# Patient Record
Sex: Female | Born: 1965 | Race: Black or African American | Hispanic: No | Marital: Married | Smoking: Never smoker
Health system: Southern US, Community
[De-identification: ages and names within clinical notes are randomized; demographics above are authoritative.]

## PROBLEM LIST (undated history)

## (undated) DIAGNOSIS — E669 Obesity, unspecified: Secondary | ICD-10-CM

## (undated) DIAGNOSIS — I639 Cerebral infarction, unspecified: Secondary | ICD-10-CM

## (undated) DIAGNOSIS — G4700 Insomnia, unspecified: Secondary | ICD-10-CM

## (undated) DIAGNOSIS — E538 Deficiency of other specified B group vitamins: Secondary | ICD-10-CM

## (undated) DIAGNOSIS — K52832 Lymphocytic colitis: Secondary | ICD-10-CM

## (undated) DIAGNOSIS — M329 Systemic lupus erythematosus, unspecified: Secondary | ICD-10-CM

## (undated) DIAGNOSIS — L304 Erythema intertrigo: Secondary | ICD-10-CM

## (undated) DIAGNOSIS — IMO0002 Reserved for concepts with insufficient information to code with codable children: Secondary | ICD-10-CM

## (undated) DIAGNOSIS — I10 Essential (primary) hypertension: Secondary | ICD-10-CM

## (undated) DIAGNOSIS — M797 Fibromyalgia: Secondary | ICD-10-CM

## (undated) DIAGNOSIS — Z8639 Personal history of other endocrine, nutritional and metabolic disease: Secondary | ICD-10-CM

## (undated) DIAGNOSIS — L821 Other seborrheic keratosis: Secondary | ICD-10-CM

## (undated) DIAGNOSIS — F419 Anxiety disorder, unspecified: Secondary | ICD-10-CM

## (undated) HISTORY — PX: BREAST LUMPECTOMY: SHX2

## (undated) HISTORY — PX: URETERAL STENT PLACEMENT: SHX822

## (undated) HISTORY — PX: BREAST REDUCTION SURGERY: SHX8

## (undated) HISTORY — PX: CHOLECYSTECTOMY: SHX55

---

## 1997-02-23 ENCOUNTER — Ambulatory Visit (HOSPITAL_COMMUNITY): Admission: RE | Admit: 1997-02-23 | Discharge: 1997-02-23 | Payer: Self-pay | Admitting: Obstetrics & Gynecology

## 1997-03-08 ENCOUNTER — Ambulatory Visit (HOSPITAL_COMMUNITY): Admission: RE | Admit: 1997-03-08 | Discharge: 1997-03-08 | Payer: Self-pay | Admitting: Obstetrics and Gynecology

## 1997-03-22 ENCOUNTER — Ambulatory Visit (HOSPITAL_COMMUNITY): Admission: RE | Admit: 1997-03-22 | Discharge: 1997-03-22 | Payer: Self-pay | Admitting: Obstetrics and Gynecology

## 1997-04-08 ENCOUNTER — Inpatient Hospital Stay (HOSPITAL_COMMUNITY): Admission: AD | Admit: 1997-04-08 | Discharge: 1997-04-08 | Payer: Self-pay | Admitting: Obstetrics and Gynecology

## 1997-04-19 ENCOUNTER — Ambulatory Visit (HOSPITAL_COMMUNITY): Admission: RE | Admit: 1997-04-19 | Discharge: 1997-04-19 | Payer: Self-pay | Admitting: Obstetrics and Gynecology

## 1997-05-03 ENCOUNTER — Encounter (HOSPITAL_COMMUNITY): Admission: RE | Admit: 1997-05-03 | Discharge: 1997-06-02 | Payer: Self-pay | Admitting: Obstetrics and Gynecology

## 1997-06-01 ENCOUNTER — Inpatient Hospital Stay (HOSPITAL_COMMUNITY): Admission: AD | Admit: 1997-06-01 | Discharge: 1997-06-04 | Payer: Self-pay | Admitting: Obstetrics and Gynecology

## 1998-07-24 ENCOUNTER — Inpatient Hospital Stay (HOSPITAL_COMMUNITY): Admission: AD | Admit: 1998-07-24 | Discharge: 1998-07-24 | Payer: Self-pay | Admitting: Obstetrics and Gynecology

## 1999-05-15 ENCOUNTER — Ambulatory Visit (HOSPITAL_BASED_OUTPATIENT_CLINIC_OR_DEPARTMENT_OTHER): Admission: RE | Admit: 1999-05-15 | Discharge: 1999-05-15 | Payer: Self-pay | Admitting: Specialist

## 1999-05-15 ENCOUNTER — Encounter (INDEPENDENT_AMBULATORY_CARE_PROVIDER_SITE_OTHER): Payer: Self-pay | Admitting: *Deleted

## 1999-10-18 ENCOUNTER — Encounter: Admission: RE | Admit: 1999-10-18 | Discharge: 1999-10-18 | Payer: Self-pay | Admitting: Otolaryngology

## 1999-10-18 ENCOUNTER — Encounter: Payer: Self-pay | Admitting: Otolaryngology

## 2000-03-12 ENCOUNTER — Ambulatory Visit (HOSPITAL_COMMUNITY): Admission: RE | Admit: 2000-03-12 | Discharge: 2000-03-12 | Payer: Self-pay | Admitting: Urology

## 2000-03-12 ENCOUNTER — Encounter: Payer: Self-pay | Admitting: Urology

## 2002-04-14 ENCOUNTER — Encounter: Payer: Self-pay | Admitting: Neurology

## 2002-04-14 ENCOUNTER — Ambulatory Visit (HOSPITAL_COMMUNITY): Admission: RE | Admit: 2002-04-14 | Discharge: 2002-04-14 | Payer: Self-pay | Admitting: Neurology

## 2003-04-08 ENCOUNTER — Other Ambulatory Visit: Admission: RE | Admit: 2003-04-08 | Discharge: 2003-04-08 | Payer: Self-pay | Admitting: Family Medicine

## 2004-04-14 ENCOUNTER — Encounter: Admission: RE | Admit: 2004-04-14 | Discharge: 2004-04-14 | Payer: Self-pay | Admitting: Family Medicine

## 2004-07-15 ENCOUNTER — Ambulatory Visit (HOSPITAL_COMMUNITY): Admission: RE | Admit: 2004-07-15 | Discharge: 2004-07-15 | Payer: Self-pay | Admitting: *Deleted

## 2004-09-30 ENCOUNTER — Encounter: Admission: RE | Admit: 2004-09-30 | Discharge: 2004-09-30 | Payer: Self-pay | Admitting: Neurology

## 2004-12-04 ENCOUNTER — Encounter: Admission: RE | Admit: 2004-12-04 | Discharge: 2004-12-04 | Payer: Self-pay | Admitting: Neurology

## 2004-12-11 ENCOUNTER — Encounter: Admission: RE | Admit: 2004-12-11 | Discharge: 2005-03-11 | Payer: Self-pay | Admitting: Neurology

## 2007-08-11 ENCOUNTER — Ambulatory Visit (HOSPITAL_COMMUNITY): Admission: RE | Admit: 2007-08-11 | Discharge: 2007-08-11 | Payer: Self-pay | Admitting: Obstetrics and Gynecology

## 2007-08-11 LAB — CONVERTED CEMR LAB: T3, Free: 2.7 pg/mL (ref 2.3–4.2)

## 2007-08-13 ENCOUNTER — Ambulatory Visit: Payer: Self-pay

## 2007-08-13 ENCOUNTER — Ambulatory Visit: Payer: Self-pay | Admitting: Cardiology

## 2007-08-13 ENCOUNTER — Encounter: Payer: Self-pay | Admitting: Cardiology

## 2007-08-19 ENCOUNTER — Ambulatory Visit (HOSPITAL_COMMUNITY): Admission: RE | Admit: 2007-08-19 | Discharge: 2007-08-19 | Payer: Self-pay | Admitting: Obstetrics and Gynecology

## 2007-08-19 ENCOUNTER — Ambulatory Visit: Payer: Self-pay | Admitting: Cardiology

## 2007-08-25 ENCOUNTER — Ambulatory Visit: Payer: Self-pay | Admitting: Cardiology

## 2007-08-26 ENCOUNTER — Ambulatory Visit (HOSPITAL_COMMUNITY): Admission: RE | Admit: 2007-08-26 | Discharge: 2007-08-26 | Payer: Self-pay | Admitting: Obstetrics and Gynecology

## 2007-09-02 ENCOUNTER — Ambulatory Visit (HOSPITAL_COMMUNITY): Admission: RE | Admit: 2007-09-02 | Discharge: 2007-09-02 | Payer: Self-pay | Admitting: Obstetrics and Gynecology

## 2007-10-08 ENCOUNTER — Inpatient Hospital Stay (HOSPITAL_COMMUNITY): Admission: AD | Admit: 2007-10-08 | Discharge: 2007-10-11 | Payer: Self-pay | Admitting: Obstetrics and Gynecology

## 2008-04-05 ENCOUNTER — Encounter: Admission: RE | Admit: 2008-04-05 | Discharge: 2008-04-05 | Payer: Self-pay | Admitting: Internal Medicine

## 2008-07-06 ENCOUNTER — Ambulatory Visit (HOSPITAL_COMMUNITY): Admission: RE | Admit: 2008-07-06 | Discharge: 2008-07-06 | Payer: Self-pay | Admitting: General Surgery

## 2008-07-06 ENCOUNTER — Encounter (INDEPENDENT_AMBULATORY_CARE_PROVIDER_SITE_OTHER): Payer: Self-pay | Admitting: General Surgery

## 2009-06-22 ENCOUNTER — Encounter (INDEPENDENT_AMBULATORY_CARE_PROVIDER_SITE_OTHER): Payer: Self-pay | Admitting: *Deleted

## 2009-07-26 ENCOUNTER — Encounter (INDEPENDENT_AMBULATORY_CARE_PROVIDER_SITE_OTHER): Payer: Self-pay | Admitting: *Deleted

## 2009-07-26 ENCOUNTER — Ambulatory Visit: Payer: Self-pay | Admitting: Gastroenterology

## 2009-07-26 DIAGNOSIS — R197 Diarrhea, unspecified: Secondary | ICD-10-CM

## 2009-07-26 DIAGNOSIS — E739 Lactose intolerance, unspecified: Secondary | ICD-10-CM

## 2009-07-26 DIAGNOSIS — E049 Nontoxic goiter, unspecified: Secondary | ICD-10-CM | POA: Insufficient documentation

## 2009-07-26 DIAGNOSIS — Z9089 Acquired absence of other organs: Secondary | ICD-10-CM

## 2009-07-26 DIAGNOSIS — M2550 Pain in unspecified joint: Secondary | ICD-10-CM | POA: Insufficient documentation

## 2009-07-27 ENCOUNTER — Encounter: Payer: Self-pay | Admitting: Gastroenterology

## 2009-07-27 DIAGNOSIS — D509 Iron deficiency anemia, unspecified: Secondary | ICD-10-CM

## 2009-07-27 DIAGNOSIS — E538 Deficiency of other specified B group vitamins: Secondary | ICD-10-CM | POA: Insufficient documentation

## 2009-07-27 LAB — CONVERTED CEMR LAB
AST: 20 units/L (ref 0–37)
Basophils Absolute: 0 10*3/uL (ref 0.0–0.1)
Calcium: 9.1 mg/dL (ref 8.4–10.5)
Chloride: 107 meq/L (ref 96–112)
Eosinophils Absolute: 0.1 10*3/uL (ref 0.0–0.7)
Ferritin: 4 ng/mL — ABNORMAL LOW (ref 10.0–291.0)
HCT: 34.6 % — ABNORMAL LOW (ref 36.0–46.0)
Hemoglobin: 11.7 g/dL — ABNORMAL LOW (ref 12.0–15.0)
IgA: 188 mg/dL (ref 68–378)
Iron: 37 ug/dL — ABNORMAL LOW (ref 42–145)
Lymphs Abs: 1.6 10*3/uL (ref 0.7–4.0)
MCHC: 33.8 g/dL (ref 30.0–36.0)
MCV: 78.6 fL (ref 78.0–100.0)
Monocytes Absolute: 0.3 10*3/uL (ref 0.1–1.0)
Potassium: 4.1 meq/L (ref 3.5–5.1)
RBC: 4.41 M/uL (ref 3.87–5.11)
Sodium: 140 meq/L (ref 135–145)
TSH: 0.7 microintl units/mL (ref 0.35–5.50)
Total Protein: 7.4 g/dL (ref 6.0–8.3)
Transferrin: 301 mg/dL (ref 212.0–360.0)
Vitamin B-12: 208 pg/mL — ABNORMAL LOW (ref 211–911)
WBC: 7.7 10*3/uL (ref 4.5–10.5)

## 2009-08-29 ENCOUNTER — Ambulatory Visit: Payer: Self-pay | Admitting: Gastroenterology

## 2009-08-31 ENCOUNTER — Telehealth: Payer: Self-pay | Admitting: Gastroenterology

## 2009-09-02 ENCOUNTER — Encounter: Payer: Self-pay | Admitting: Gastroenterology

## 2009-10-13 ENCOUNTER — Telehealth (INDEPENDENT_AMBULATORY_CARE_PROVIDER_SITE_OTHER): Payer: Self-pay | Admitting: *Deleted

## 2010-02-04 ENCOUNTER — Encounter: Payer: Self-pay | Admitting: Neurology

## 2010-02-06 ENCOUNTER — Encounter: Payer: Self-pay | Admitting: Obstetrics and Gynecology

## 2010-02-16 NOTE — Procedures (Signed)
Summary: Colonoscopy  Patient: [name] Note: All result statuses are Final unless otherwise noted.  Tests: (1) Colonoscopy (COL)   COL Colonoscopy           DONE     [facility]     [address]     [place], [place]  [address]           COLONOSCOPY PROCEDURE REPORT           PATIENT:  Jo Chung, Jo Chung  MR#:  [id number]     BIRTHDATE:  05/21/1965, 44 yrs. old  GENDER:  female     ENDOSCOPIST:  Vania Rea. Jarold Motto, MD, [facility] [address]     REF. BY:  Guerry Bruin, M.D.     PROCEDURE DATE:  08/29/2009     PROCEDURE:  Colonoscopy with biopsy     ASA CLASS:  Class II     INDICATIONS:  unexplained diarrhea, surveillance     MEDICATIONS:   Fentanyl 100 mcg IV, Versed 10 mg IV           DESCRIPTION OF PROCEDURE:   After the risks benefits and     alternatives of the procedure were thoroughly explained, informed     consent was obtained.  Digital rectal exam was performed and     revealed no abnormalities.   The LB [id number] endoscope     was introduced through the anus and advanced to the cecum, which     was identified by both the appendix and ileocecal valve, without     limitations.  The quality of the prep was excellent, using     MoviPrep.  The instrument was then slowly withdrawn as the colon     was fully examined.     <<PROCEDUREIMAGES>>           FINDINGS:  No polyps or cancers were seen.  This was otherwise a     normal examination of the colon. RANDOM BIOPSIES DONE.     Retroflexed views in the rectum revealed no abnormalities.    The     scope was then withdrawn from the patient and the procedure     completed.           COMPLICATIONS:  None     ENDOSCOPIC IMPRESSION:     1) No polyps or cancers     2) Otherwise normal examination     BILE-SALT ENTEROPATHY.R/O MICROSCOPIC/COLLAGENOUS COLITIS.     RECOMMENDATIONS:     1) Await biopsy results     2) Repeat Colonscopy in 10 years.     COLESTID DOSES AS NEEDED     REPEAT EXAM:  No        ______________________________     Vania Rea. Jarold Motto, MD, [facility]           CC:           n.     eSIGNED:   Vania Rea. Patterson at 08/29/2009 02:04 PM           Marcy Siren, [id number]  Note: An exclamation mark (!) indicates a result that was not dispersed into the flowsheet. Document Creation Date: 08/29/2009 2:05 PM _______________________________________________________________________  (1) Order result status: Final Collection or observation date-time: 08/29/2009 13:57 Requested date-time:  Receipt date-time:  Reported date-time:  Referring Physician:   Ordering Physician: Sheryn Bison [phone]) Specimen Source:  Source: Launa Grill Order Number: [id number] Lab site:   Appended Document: Colonoscopy     Procedures Next Due Date:  Colonoscopy: 08/2019

## 2010-02-16 NOTE — Progress Notes (Signed)
Summary: Nascobal  Phone Note Outgoing Call   Call placed by: Ashok Cordia RN,  August 31, 2009 9:56 AM Summary of Call: Per Dr. Jarold Motto,  Call pt and rx Nascobal.  LM for pt to call. Initial call taken by: Ashok Cordia RN,  August 31, 2009 9:57 AM  Follow-up for Phone Call        Rx mailed to pt for nascobal.  Co-pay card mailed also.  Pt asks if it is OK to take colested more than once a day if needed.  Does well most days on one daily but if gets stressed then will have diarrhea.  On these days can she take 2 tabs? Follow-up by: Ashok Cordia RN,  August 31, 2009 10:02 AM  Additional Follow-up for Phone Call Additional follow up Details #1::        yes...she can titrate per her symptoms up to 4 /day Additional Follow-up by: Mardella Layman MD FACG,  August 31, 2009 10:21 AM    Additional Follow-up for Phone Call Additional follow up Details #2::    Pt notified. Follow-up by: Ashok Cordia RN,  August 31, 2009 10:48 AM  New/Updated Medications: NASCOBAL 500 MCG/0.1ML SOLN (CYANOCOBALAMIN) 1 spray in one nostral once a week Prescriptions: NASCOBAL 500 MCG/0.1ML SOLN (CYANOCOBALAMIN) 1 spray in one nostral once a week  #1 x 6   Entered by:   Ashok Cordia RN   Authorized by:   Mardella Layman MD [facility]   Signed by:   Ashok Cordia RN on 08/31/2009   Method used:   Print then Mail to Patient   RxID:   [id number]

## 2010-02-16 NOTE — Progress Notes (Signed)
  Phone Note Other Incoming   Request: Send information Summary of Call: Request for records received from patient to go to Dr. Virgel Manifold. Request forwarded to [employer].

## 2010-02-16 NOTE — Assessment & Plan Note (Signed)
Summary: DIARRHEA X 1 YEAR...AS.   History of Present Illness Visit Type: Initial Visit Primary GI MD: Sheryn Bison MD FACP FAGA Primary Provider: Ernesto Rutherford, MD Chief Complaint: Diarrhea x 1 year History of Present Illness:   45 year old African American female who has a vague previous diagnosis of collagen vascular disease, fibromyalgia, possible sarcoidosis, and chronic hypothyroidism with associated thyroid goiter. She is on daily Synthroid and Xanax.  She is referred for two-year history of intermittent alternating diarrhea and constipation which has become predominantly diarrhea since cholecystectomy one year ago. She denies any systemic complaints, rectal bleeding, or dietary intolerances except to lactose. She's had no anorexia, weight loss, fever, chills, skin rashes, but does have vague arthralgias. She has not had previous 2 exams her endoscopic procedures. She works as an Charity fundraiser. She denies foreign travel or sick family members at home.   GI Review of Systems    Reports abdominal pain and  bloating.     Location of  Abdominal pain: lower abdomen.    Denies acid reflux, belching, chest pain, dysphagia with liquids, dysphagia with solids, heartburn, loss of appetite, nausea, vomiting, vomiting blood, weight loss, and  weight gain.      Reports change in bowel habits and  diarrhea.     Denies anal fissure, black tarry stools, constipation, diverticulosis, fecal incontinence, heme positive stool, hemorrhoids, irritable bowel syndrome, jaundice, light color stool, liver problems, rectal bleeding, and  rectal pain. Preventive Screening-Counseling & Management  Alcohol-Tobacco     Smoking Status: never      Drug Use:  no.      Current Medications (verified): 1)  Synthroid 112 Mcg Tabs (Levothyroxine Sodium) .... Once Daily 2)  Xanax 0.5 Mg Tabs (Alprazolam) .... Take 1 Tablet By Mouth As Needed For Sleep  Allergies (verified): No Known Drug Allergies  Past  History:  Past medical, surgical, family and social histories (including risk factors) reviewed for relevance to current acute and chronic problems.  Past Medical History: Hypothyroidism Lupus Sarcodoiosis Depression Fibromyalgia  Past Surgical History: Breast-Reduction Cholecystectomy C-Section X 2 Tubal Ligation Breast-Lumpectomy UPJ obstruction/ Kidney  Family History: Reviewed history from 07/25/2009 and no changes required. Family History of Heart Disease: Mother Family History of Cervical Cancer: Mother No FH of Colon Cancer:  Social History: Reviewed history and no changes required. 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  Alcohol Use - no Daily Caffeine Use 2 Illicit Drug Use - no Smoking Status:  never Drug Use:  no  Review of Systems       The patient complains of sleeping problems.  The patient denies allergy/sinus, anemia, anxiety-new, arthritis/joint pain, back pain, blood in urine, breast changes/lumps, change in vision, confusion, cough, coughing up blood, depression-new, fainting, fatigue, fever, headaches-new, hearing problems, heart murmur, heart rhythm changes, itching, menstrual pain, muscle pains/cramps, night sweats, nosebleeds, pregnancy symptoms, shortness of breath, skin rash, sore throat, swelling of feet/legs, swollen lymph glands, thirst - excessive , urination - excessive , urination changes/pain, urine leakage, vision changes, and voice change.   General:  Complains of sleep disorder; denies fever, chills, sweats, anorexia, fatigue, weakness, malaise, and weight loss. MS:  Complains of joint swelling and joint stiffness; denies joint pain / LOM, joint deformity, low back pain, muscle weakness, muscle cramps, muscle atrophy, leg pain at night, leg pain with exertion, and shoulder pain / LOM hand / wrist pain (CTS). Derm:  Denies rash, itching, dry skin, hives, moles, warts, and unhealing ulcers. Neuro:  Denies weakness, paralysis, abnormal  sensation, seizures, syncope, tremors, vertigo, transient blindness, frequent falls, frequent headaches, difficulty walking, headache, sciatica, radiculopathy other:, restless legs, memory loss, and confusion. Psych:  Complains of depression; denies anxiety, memory loss, suicidal ideation, hallucinations, paranoia, phobia, and confusion. Heme:  Denies bruising, bleeding, enlarged lymph nodes, and pagophagia.  Vital Signs:  Patient profile:   45 year old female Height:      59 inches Weight:      164.38 pounds BMI:     33.32 Pulse rate:   88 / minute Pulse rhythm:   regular BP sitting:   126 / 84  (left arm) Cuff size:   regular  Vitals Entered By: June McMurray CMA Duncan Dull) (July 26, 2009 2:30 PM)  Physical Exam  General:  Well developed, well nourished, no acute distress.healthy appearing.   Head:  Normocephalic and atraumatic. Eyes:  PERRLA, no icterus.exam deferred to patient's ophthalmologist.   Neck:  Supple; no masses or thyromegaly.thyroid gland is palpable but is nonnodular nontender. Lungs:  Clear throughout to auscultation. Heart:  Regular rate and rhythm; no murmurs, rubs,  or bruits. Abdomen:  Soft, nontender and nondistended. No masses, hepatosplenomegaly or hernias noted. Normal bowel sounds. Rectal:  Normal exam.Soft normal color stool which is guaiac-negative. Msk:  Symmetrical with no gross deformities. Normal posture. Extremities:  No clubbing, cyanosis, edema or deformities noted. Neurologic:  Alert and  oriented x4;  grossly normal neurologically. Cervical Nodes:  No significant cervical adenopathy. Psych:  Alert and cooperative. Normal mood and affect.   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 Labs and stool exams have been ordered and we'll check screening colonoscopy. I placed her on Colestid 1 g in  midmorning as tolerated along with lactose avoidance and elimination of fructose and sorbitol from her diet. Orders: TLB-CBC Platelet - w/Differential (85025-CBCD) TLB-BMP (Basic Metabolic Panel-BMET) (80048-METABOL) TLB-Hepatic/Liver Function Pnl (80076-HEPATIC) TLB-TSH (Thyroid Stimulating Hormone) (84443-TSH) TLB-B12, Serum-Total ONLY (65784-O96) TLB-Ferritin (82728-FER) TLB-Folic Acid (Folate) (82746-FOL) TLB-IBC Pnl (Iron/FE;Transferrin) (83550-IBC) TLB-IgA (Immunoglobulin A) (82784-IGA) TLB-Sedimentation Rate (ESR) (85652-ESR) T-Sprue Panel (Celiac Disease Aby Eval) (83516x3/[address]) T-Stool Fats [place] Stain [phone]) T-Stool Giardia / Crypto- EIA ([address]) T-Stool for O&P (72536-64403)  Problem # 2:  ARTHRALGIA (ICD-719.40) Assessment: Unchanged ??? underlying SLE. Records have been requested.  Problem # 3:  GOITER (ICD-240.9) Assessment: Unchanged History of chronic thyroiditis now on Synthroid therapy.  Problem # 4:  CHOLECYSTECTOMY, LAPAROSCOPIC, HX OF (ICD-V45.79) Assessment: Comment Only  Problem # 5:  LACTOSE INTOLERANCE (ICD-271.3) Assessment: Unchanged Lactose free diet suggested with p.r.n. Lactaid use.  Patient Instructions: 1)  Please go to the basement for lab work. 2)  Begin Colestid once daily. 3)  You are scheduled for a colonoscopy. 4)  The medication list was reviewed and reconciled.  All changed / newly prescribed medications were explained.  A complete medication list was provided to the patient / caregiver. 5)  Copy sent to : Dr. Ernesto Rutherford 6)  Please continue current medications.  7)  Lactose Intolerance brochure given.  8)  Stool exams ordered to exclude parasites and malabsorption.  Appended Document: DIARRHEA X 1 YEAR...AS.    Clinical Lists Changes  Medications: Added new medication of MOVIPREP 100 GM  SOLR (PEG-KCL-NACL-NASULF-NA ASC-C) As per prep instructions. - Signed Added new medication of COLESTID 1 GM TABS (COLESTIPOL  HCL) 1 by mouth once daily - Signed Rx of MOVIPREP 100 GM  SOLR (PEG-KCL-NACL-NASULF-NA ASC-C) As per prep instructions.;  #  1 x 0;  Signed;  Entered by: Ashok Cordia RN;  Authorized by: Mardella Layman MD [facility];  Method used: Electronically to [facility] [address] [phone]*, [address], [place], [place], [place]  [address], Ph: [phone] or [phone], Fax: [phone] Rx of COLESTID 1 GM TABS (COLESTIPOL HCL) 1 by mouth once daily;  #30 x 11;  Signed;  Entered by: Ashok Cordia RN;  Authorized by: Mardella Layman MD [facility];  Method used: Electronically to [facility][id number]*, [address], [place], [place], [place]  [address], Ph: [phone] or [phone], Fax: [phone] Orders: Added new Test order of Colonoscopy (Colon) - Signed    Prescriptions: COLESTID 1 GM TABS (COLESTIPOL HCL) 1 by mouth once daily  #30 x 11   Entered by:   Ashok Cordia RN   Authorized by:   Mardella Layman MD [facility]   Signed by:   Ashok Cordia RN on 07/26/2009   Method used:   Electronically to        [facility] [address] [phone]* (retail)       [address]       [place], [place]  [address]       Ph: [phone] or [phone]       Fax: [phone]   RxID:   [id number] MOVIPREP 100 GM  SOLR (PEG-KCL-NACL-NASULF-NA ASC-C) As per prep instructions.  #1 x 0   Entered by:   Ashok Cordia RN   Authorized by:   Mardella Layman MD [facility]   Signed by:   Ashok Cordia RN on 07/26/2009   Method used:   Electronically to        [facility] [address] [phone]* (retail)       [address]       [place], [place]  [address]       Ph: [phone] or [phone]       Fax: [phone]   RxID:   [id number]

## 2010-02-16 NOTE — Letter (Signed)
Summary: [facility] Instructions  [facility]  [address] [place], [place] [address]   Phone: [phone]  Fax: [phone]       Jo Chung    25-May-1965    MRN: [id number]        Procedure Day /Date: Friday, 08/05/09     Arrival Time: 2:30      Procedure Time: 3:30     Location of Procedure:                    _ X_  [facility] (4th Floor)                        PREPARATION FOR COLONOSCOPY WITH MOVIPREP   Starting 5 days prior to your procedure 07/29/09 do not eat nuts, seeds, popcorn, corn, beans, peas,  salads, or any raw vegetables.  Do not take any fiber supplements (e.g. Metamucil, Citrucel, and Benefiber).  THE DAY BEFORE YOUR PROCEDURE         DATE: 08/04/09    DAY: Thursday  1.  Drink clear liquids the entire day-NO SOLID FOOD  2.  Do not drink anything colored red or purple.  Avoid juices with pulp.  No orange juice.  3.  Drink at least 64 oz. (8 glasses) of fluid/clear liquids during the day to prevent dehydration and help the prep work efficiently.  CLEAR LIQUIDS INCLUDE: Water Jello Ice Popsicles Tea (sugar ok, no milk/cream) Powdered fruit flavored drinks Coffee (sugar ok, no milk/cream) Gatorade Juice: apple, white grape, white cranberry  Lemonade Clear bullion, consomm, broth Carbonated beverages (any kind) Strained chicken noodle soup Hard Candy                             4.  In the morning, mix first dose of MoviPrep solution:    Empty 1 Pouch A and 1 Pouch B into the disposable container    Add lukewarm drinking water to the top line of the container. Mix to dissolve    Refrigerate (mixed solution should be used within 24 hrs)  5.  Begin drinking the prep at 5:00 p.m. The MoviPrep container is divided by 4 marks.   Every 15 minutes drink the solution down to the next mark (approximately 8 oz) until the full liter is complete.   6.  Follow completed prep with 16 oz of clear liquid of your choice (Nothing  red or purple).  Continue to drink clear liquids until bedtime.  7.  Before going to bed, mix second dose of MoviPrep solution:    Empty 1 Pouch A and 1 Pouch B into the disposable container    Add lukewarm drinking water to the top line of the container. Mix to dissolve    Refrigerate  THE DAY OF YOUR PROCEDURE      DATE: 08/05/09    DAY: Friday  Beginning at 10:30 a.m. (5 hours before procedure):         1. Every 15 minutes, drink the solution down to the next mark (approx 8 oz) until the full liter is complete.  2. Follow completed prep with 16 oz. of clear liquid of your choice.    3. You may drink clear liquids until 1:30 (2 HOURS BEFORE PROCEDURE).   MEDICATION INSTRUCTIONS  Unless otherwise instructed, you should take regular prescription medications with a small sip of water   as early as possible  the morning of your procedure.                 OTHER INSTRUCTIONS  You will need a responsible adult at least 43 years of age to accompany you and drive you home.   This person must remain in the waiting room during your procedure.  Wear loose fitting clothing that is easily removed.  Leave jewelry and other valuables at home.  However, you may wish to bring a book to read or  an iPod/MP3 player to listen to music as you wait for your procedure to start.  Remove all body piercing jewelry and leave at home.  Total time from sign-in until discharge is approximately 2-3 hours.  You should go home directly after your procedure and rest.  You can resume normal activities the  day after your procedure.  The day of your procedure you should not:   Drive   Make legal decisions   Operate machinery   Drink alcohol   Return to work  You will receive specific instructions about eating, activities and medications before you leave.    The above instructions have been reviewed and explained to me by   _______________________    I fully understand and can  verbalize these instructions _____________________________ Date _________

## 2010-02-16 NOTE — Letter (Signed)
Summary: New Patient letter  [facility]  [address] [place], [place] [address]   Phone: [phone]  Fax: [phone]       06/22/2009 MRN: [id number]  Jo Chung [address] [place], [place]  [address]  Dear Jo Chung,  Welcome to the Gastroenterology Division at [facility].    You are scheduled to see Dr. Jarold Motto on 07/26/2009 at 2:45PM on the 3rd floor at [facility], [address]  We ask that you try to arrive at our office 15 minutes prior to your appointment time to allow for check-in.  We would like you to complete the enclosed self-administered evaluation form prior to your visit and bring it with you on the day of your appointment.  We will review it with you.  Also, please bring a complete list of all your medications or, if you prefer, bring the medication bottles and we will list them.  Please bring your insurance card so that we may make a copy of it.  If your insurance requires a referral to see a specialist, please bring your referral form from your primary care physician.  Co-payments are due at the time of your visit and may be paid by cash, check or credit card.     Your office visit will consist of a consult with your physician (includes a physical exam), any laboratory testing he/she may order, scheduling of any necessary diagnostic testing (e.g. x-ray, ultrasound, CT-scan), and scheduling of a procedure (e.g. Endoscopy, Colonoscopy) if required.  Please allow enough time on your schedule to allow for any/all of these possibilities.    If you cannot keep your appointment, please call [phone] to cancel or reschedule prior to your appointment date.  This allows [place] the opportunity to schedule an appointment for another patient in need of care.  If you do not cancel or reschedule by 5 p.m. the business day prior to your appointment date, you will be charged a $50.00 late cancellation/no-show fee.    Thank you for choosing  [facility] for your medical needs.  We appreciate the opportunity to care for you.  Please visit [place] at our website  to learn more about our practice.                     Sincerely,                                                             The Gastroenterology Division

## 2010-02-16 NOTE — Letter (Signed)
Summary: Patient Notice- Colon Biospy Results  [facility]  [address] [place], [place] [address]   Phone: [phone]  Fax: [phone]        September 02, 2009 MRN: [id number]    [facility] [address] [place], [place]  [address]    Dear Jo Chung,  I am pleased to inform you that the biopsies taken during your recent colonoscopy did not show any evidence of cancer upon pathologic examination.  Additional information/recommendations:  __No further action is needed at this time.  Please follow-up with      your primary care physician for your other healthcare needs.  __Please call [phone] to schedule a return visit to review      your condition.  _x_Continue with the treatment plan as outlined on the day of your      exam.  __You should have a repeat colonoscopy examination for this problem           in 10_ years.  Please call us if you are having persistent problems or have questions about your condition that have not been fully answered at this time.  Sincerely,  Mardella Layman MD [facility]   This letter has been electronically signed by your physician.  Appended Document: Patient Notice- Colon Biospy Results letter mailed

## 2010-04-24 LAB — CBC
HCT: 37.4 % (ref 36.0–46.0)
MCHC: 33.5 g/dL (ref 30.0–36.0)
MCV: 79.5 fL (ref 78.0–100.0)
Platelets: 268 10*3/uL (ref 150–400)
WBC: 7.5 10*3/uL (ref 4.0–10.5)

## 2010-05-30 NOTE — Op Note (Signed)
Jo Chung, FOGARTY               ACCOUNT NO.:  [id number]   MEDICAL RECORD NO.:  [id number]          PATIENT TYPE:  AMB   LOCATION:  SDS                          FACILITY:  [facility]   PHYSICIAN:  Ollen Gross. Vernell Morgans, M.D. DATE OF BIRTH:  12-23-1965   DATE OF PROCEDURE:  07/06/2008  DATE OF DISCHARGE:  07/06/2008                               OPERATIVE REPORT   PREOPERATIVE DIAGNOSIS:  Gallstones.   POSTOPERATIVE DIAGNOSIS:  Gallstones.   PROCEDURE:  Laparoscopic cholecystectomy with intraoperative  cholangiogram.   SURGEON:  Ollen Gross. Vernell Morgans, MD   ASSISTANT:  Ardeth Sportsman, MD   ANESTHESIA:  General endotracheal.   DESCRIPTION OF PROCEDURE:  After informed consent was obtained, the  patient was brought to the operating room and placed in the supine  position on the operating table.  After induction of general anesthesia,  the patient's abdomen was prepped with Betadine and draped in usual  sterile manner.  The area below the umbilicus was infiltrated with 0.25%  Marcaine.  A small incision was made with a 15-blade knife.  This  incision was carried down to the subcutaneous tissue bluntly with  hemostat and [employer] retractors until the linea alba was identified.  The linea alba was incised with a 15-blade knife, and each side was  grasped with Kocher clamps and elevated anteriorly.  The preperitoneal  space was then probed bluntly with a hemostat until the peritoneum was  opened and access was gained to the abdominal cavity.  A 0 Vicryl purse-  string stitch was then placed in the fascia around the opening.  A  Hasson cannula was placed through the opening and anchored in place with  a previously placed Vicryl purse-string stitch.  The abdomen was then  insufflated with carbon dioxide without difficulty.  A laparoscope was  inserted through the Hasson cannula and the right upper quadrant was  inspected.  The dome of the gallbladder and liver readily identified.  Next, the  epigastric region was infiltrated with 0.25% Marcaine.  A  small incision was made with a 15-blade knife.  A 10-mm port was placed  bluntly through this incision into the abdominal cavity under direct  vision.  Sites were then chosen laterally on the right side of the  abdomen for placement of 5-mm ports.  Each of these areas were  infiltrated with 0.25% Marcaine.  Small stab incisions were made with a  15-blade knife.  The 5-mm ports were then placed bluntly through these  incisions into the abdominal cavity under direct vision.  A blunt  grasper was placed through the lateral-most 5-mm port and used to grasp  the dome of gallbladder and elevated anteriorly and superiorly.  Another  blunt grasper was placed through the other 5-mm port and used to retract  on the body and neck of the gallbladder.  A dissector was placed through  the epigastric port and using the electrocautery, the peritoneal  reflection was opened at the gallbladder neck.  Blunt dissection was  then carried out in this area until the gallbladder neck cystic duct  junction was  readily identified and a good window was created.  A single  clip was placed on the gallbladder neck.  A small ductotomy was made  just below the clip with a laparoscopic scissors.  A Cook catheter was  then placed percutaneously through the anterior abdominal wall and the  right upper quadrant under direct vision.  The sharp stylet was removed.  The [facility] catheter was flushed.  The [facility] catheter was then placed in the  cystic duct and anchored in place with a clip.  Cholangiogram was  obtained that showed no filling defects, good emptying in duodenum, and  adequate length of cystic duct.  The anchoring clip and catheters were  removed from the patient.  Three clips were placed proximally in the  cystic duct, and duct was divided between the 2.  Next, posterior to  this, the cystic artery was identified and again dissected bluntly in a   circumferentially until a good window was created.  Two clips were  placed proximally and 1 distally on the artery, and the artery was  divided between the 2 with laparoscopic scissors.  Small branches of the  artery were identified and were controlled with clips.  Next, a  laparoscopic hook cautery device was used to separate the gallbladder  from the liver bed prior to completely detaching the gallbladder from  liver bed.  The liver bed was inspected and several small bleeding  points were coagulated with the electrocautery until the area was  completely hemostatic.  The laparoscopic bag was then inserted through  the epigastric port, the gallbladder was placed in the bag, and the bag  was sealed.  The abdomen was then irrigated with copious amounts of  saline until the effluent was clear.  The laparoscope was then moved to  the epigastric port.  A gallbladder grasper was placed through the  Hasson cannula and used to grasp the opening of the bag.  The bag with  the gallbladder was removed through the infraumbilical port without  difficulty.  The fascial defect was closed with the previously placed  Vicryl purse-string stitch as well as with another figure-of-eight 0  Vicryl stitch.  The rest of ports were then removed under direct vision  and were found to be hemostatic.  The gas was allowed to escape.  Skin  incisions were all closed with interrupted 4-0 Monocryl subcuticular  stitches.  Dermabond dressings were applied.  The patient tolerated the  procedure well.  At the end of the case, all needle, sponge, and  instrument counts were correct.  The patient was then awakened and taken  to recovery in stable condition.      Ollen Gross. Vernell Morgans, M.D.  Electronically Signed     PST/MEDQ  D:  07/06/2008  T:  07/06/2008  Job:  [id number]

## 2010-05-30 NOTE — Discharge Summary (Signed)
NAMESALVADOR, BIGBEE               ACCOUNT NO.:  [id number]   MEDICAL RECORD NO.:  [id number]          PATIENT TYPE:  INP   LOCATION:  9137                          FACILITY:  [facility]   PHYSICIAN:  Janine Limbo, M.D.DATE OF BIRTH:  1965/02/14   DATE OF ADMISSION:  10/08/2007  DATE OF DISCHARGE:  10/11/2007                               DISCHARGE SUMMARY   ADMITTING DIAGNOSES:  1. Intrauterine pregnancy at 35-5/7 weeks.  2. Preeclampsia.  3. Positive antinuclear antibody.  4. Sarcoidosis.  5. Positive Kell antibody titer  6. Desire for surgical sterilization.  7. History of 2 prior cesarean sections.  8. Hypothyroidism.  9. History of stillbirth.  10.Advanced maternal age.   DISCHARGE DIAGNOSES:  1. Intrauterine pregnancy at 35-5/7 weeks.  2. Preeclampsia.  3. Positive antinuclear antibody.  4. Sarcoidosis.  5. Positive Kell antibody titer  6. Desire for surgical sterilization.  7. History of 2 prior cesarean sections.  8. Hypothyroidism.  9. History of stillbirth.  10.Advanced maternal age.   PROCEDURES:  1. Repeat low-transverse cesarean section.  2. Bilateral tubal ligation.  3. Combination spinal epidural.   HOSPITAL COURSE:  Ms. Mcleish is a 45 year old gravida 6, para 4-0-1-3  at 35-5/7 weeks.  She presented from the office for cesarean section on  October 08, 2007, secondary to previous cesarean section and  preeclampsia and previous IUFD.  Her pregnancy had been remarkable for:  1. Advanced maternal age.  2. Grand multiparous.  3. Previous cesarean section x2 with 2 subsequently VBAC.  4. Hypothyroidism.  5. History of preeclampsia.  6. Lupus.  7. Sarcoidosis.  8. Depression.  9. Breast reduction.  10.Kell antibody with fourth pregnancy and subsequent IUFD.  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  12.IUFD with abruption.  13.History of pyelectasis with a history of ureteral obstruction.  14.Fetal tricuspid regurgitation on fetal echo.  15.Desires tubal.  16.Two C-sections with 2 subsequent VBAC and desires repeat C-section.   The patient was taken to the operating room by Dr. Pennie Rushing after  ensuring type and cross matching 2 units of packed red blood cells was  available.  A combination of spinal and epidural catheter was placed.  Although, the epidural was not dosed.  The repeat low-transverse  cesarean section and bilateral tubal ligation were performed by Dr.  Pennie Rushing.  Findings were a viable female by the name of Gerilyn Pilgrim, weight 6  pounds 15 ounces, and Apgars 8 and 9.  Tubes, uterus, and ovary were  normal.  The patient was taken to recovery in good condition.  Infant  was taken to the full-term nursery.  The patient was placed on magnesium  sulfate subsequent to delivery secondary to the previously noted  preeclampsia.  Her blood pressures were in 140 over high 80s.  Foley was  still in place.  On postop day #1, she was pumping for breast milk.  Her  blood pressure was 125/76.  Lochia was scant.  Fundus was firm.  Incision was clean and dry.  Dressing was clean, dry and intact.   Hemoglobin on day 1 was 10.8, white blood cell count 70.1,  platelet  count of 222.  Comprehensive metabolic panel was within normal limits  with SGOT of 23, SGPT of 17, BUN of 2, and creatinine of 0.41.  Magnesium sulfate was 5.0.  The patient's weight was 172.  The patient  was continued on magnesium for 24 hours.  By the afternoon of October 10, 2007, she was doing well.  She was still in NICU on magnesium, but  her pressures were in 136/87 and 110-136 over 58-87 range.  Vital signs  were stable.  She was afebrile.  Her weight was 175.5.  Urine output was  good.  Hemoglobin was 9.1, hematocrit 28%, and platelets 203.   Her physical exam was within normal limits.   Magnesium sulfate was discontinued and she was transferred to the floor.  She was continued on her prednisone at 10 mg p.o. daily and her  Synthroid 112 mcg per day.  On  postop day #3, she was doing well.  She  was up ad lib.  Her vital signs were stable.  She was pumping for breast  milk.  Her incision was clean, dry, and intact.  Extremities were within  normal limits.  Lochia was scant.  Fundus was firm.  She was deemed to  receive full benefit of hospital stay and was discharged home.   DISCHARGE INSTRUCTIONS:  Per [facility] handout.  The patient  will also follow up with her rheumatologist for followup medication.   PLAN:   DISCHARGE MEDICATIONS:  1. Motrin 600 mg p.o. q.6 h. p.r.n. pain.  2. Percocet 5/325 one to two p.o. daily q.4 h. p.r.n. pain.  3. The patient will continue her Synthroid 112 mcg p.o. daily.  4. Prednisone 10 mg p.o. daily.  5. Plaquenil use will be determined by her rheumatologist.   Discharge followup will occur in 6 weeks [facility].  Smart  start nurses will go up to see the patient this week for blood pressure  check.  She will follow up with a rheumatologist.      Renaldo Reel. Emilee Hero, C.N.M.      Janine Limbo, M.D.  Electronically Signed    VLL/MEDQ  D:  10/11/2007  T:  10/11/2007  Job:  [id number]

## 2010-05-30 NOTE — Assessment & Plan Note (Signed)
[facility]                            CARDIOLOGY OFFICE NOTE   [name], [name]                        MRN:          [id number]  DATE:08/11/2007                            DOB:          06-Sep-1965    REFERRING PHYSICIAN:  Hal Morales, M.D.   HISTORY OF PRESENT ILLNESS:  This is a 45 year old with no prior cardiac  problems who presents for evaluation of palpitations.  The patient does  have a history of recently diagnosed lupus, cutaneous sarcoidosis, and  hypothyroidism.  The patient is now [redacted] weeks pregnant.  She states that  for the last 12 to 14 weeks she has had episodes of palpitations several  times a day.  When these come on, they have strong and fast heart beats.  They are associated with a mild shortness of breath as well as mild  lightheadedness.  She has had no episodes of syncope.  Episodes tend to  last about 5 to 10 minutes.  They are not associated with chest pain.  They are not brought on by any particular event.  They seem to happen  fairly at random.  Also, the patient does note some shortness of breath  when she is climbing steps now, when she gets to the top of the flight  of stairs, that is new since she has been pregnant.  She states that she  really did not have shortness of breath with her 3 prior pregnancies.  The patient is not using any illicit drugs.   PAST MEDICAL HISTORY:  1. Systemic lupus erythematosus, this was diagnosed at this pregnancy.      Her symptoms have been arthritis and muscle pain.  2. Sarcoidosis.  This is primarily manifested cutaneously.  She has no      history of pulmonary sarcoid.  3. Hypothyroidism.  4. History of 3 prior pregnancies.  Her first pregnancy was      complicated by abruptio placentae and preeclampsia.  Her next 2      pregnancies were uneventful.   MEDICATIONS:  The patient takes prednisone 5 mg daily and Synthroid 112  mcg daily and a multivitamin.   SOCIAL HISTORY:   The patient lives with her husband.  She has 3 other  children.  She does not smoke.  She does not use any illicit drugs.  She  does not drink alcohol.   FAMILY HISTORY:  The patient's family history is significant mainly for  hypertension.   EKG, the patient's EKG was reviewed today, showed normal sinus rhythm.  No Q waves, normal axis and intervals, and no ST-T wave changes.   REVIEW OF SYSTEMS:  Negative except as noted in the history of present  illness.  She pertinently denies headaches.  She denies cough.  She  denies shortness of breath at rest.   PHYSICAL EXAMINATION:  VITAL SIGNS:  Heart rate 90 and regular and blood  pressure 120/78.  GENERAL:  No apparent distress.  NECK:  No JVD.  No carotid bruits.  LUNGS:  Clear to auscultation bilaterally.  HEART:  Regular S1 and S2.  There is a 2/6 systolic ejection murmur at  the upper sternal border.  ABDOMEN:  Gravid and nontender.  EXTREMITIES:  No edema.  There is 2+ dorsalis pedis pulses bilaterally.   ASSESSMENT/PLAN:  This is a 45 year old with a history of reasonably  diagnosed lupus, cutaneous sarcoid, and hypothyroidism who presents to  the cardiology clinic for evaluation of palpitations.  1. Palpitations.  The patient has these several times a day.  They are      associated with mild shortness of breath and mild lightheadedness.      There has been no frank syncope.  We will plan on going ahead and      evaluating this with a 48-hour Holter monitor.  We will also check      a TSH and a free T4 to make sure her hypothyroidism is under      adequate control and that she is not being overdosed with      Synthroid.  2. Shortness of breath.  The patient's shortness of breath that she      gets when she climbs stairs is likely a normal pregnancy related      manifestation.  However, she states she did not have this with her      first 3 pregnancies.  Therefore, we will go ahead and get an      echocardiogram to ensure that  her LV function is normal.  Also, she      does have a history of cutaneous sarcoidosis, and though unlikely      there is a chance that she could have involvement of her heart with      sarcoid which could lead to both the palpitations and shortness of      breath.  3. We will bring the patient back for evaluation after a Holter      monitor and echocardiogram in 7 to 10 days.     Jo Ancona, MD  Electronically Signed    DM/MedQ  DD: 08/11/2007  DT: 08/12/2007  Job #: [id number]   cc:   Hal Morales, M.D.  Noralyn Pick. Eden Emms, MD, [facility]

## 2010-05-30 NOTE — Op Note (Signed)
Jo Chung, Jo Chung               ACCOUNT NO.:  [id number]   MEDICAL RECORD NO.:  [id number]          PATIENT TYPE:  INP   LOCATION:  9371                          FACILITY:  [facility]   PHYSICIAN:  Hal Morales, M.D.DATE OF BIRTH:  May 09, 1965   DATE OF PROCEDURE:  DATE OF DISCHARGE:                               OPERATIVE REPORT   PREOPERATIVE DIAGNOSES:  Intrauterine pregnancy at 35 weeks and 5 days,  preeclampsia, positive antinuclear antibody sarcoidosis, positive Kell  antibody titer, desire for surgical sterilization, history of two prior  cesarean sections, hypothyroidism, and history of stillbirth advanced  maternal age.   POSTOPERATIVE DIAGNOSES:  Intrauterine pregnancy at 35 weeks and 5 days,  preeclampsia, positive antinuclear antibody sarcoidosis, positive Kell  antibody titer, desire for surgical sterilization, history of two prior  cesarean sections, hypothyroidism, and history of stillbirth advanced  maternal age.   OPERATIONS:  Repeat low-transverse cesarean section and bilateral tubal  ligation.   SURGEON:  Hal Morales, MD   FIRST ASSISTANT:  Renaldo Reel. Emilee Hero, C.N.M.   ANESTHESIA:  Combination spinal, epidural.   ESTIMATED BLOOD LOSS:  750 mL.   COMPLICATIONS:  None.   FINDINGS:  The uterus, tubes and ovaries were normal for the gravid  state.  The patient was delivered of a female infant whose name is Gerilyn Pilgrim  weighing 6 pounds 15 ounces with Apgars of 8 and 9 at 1 and 5 minutes  respectively.   PROCEDURE:  The patient was taken to the operating room after  appropriate identification and after typing and cross-matching 2 units  of packed red blood cells.  She was placed on the operating table.  A  combination of spinal and epidural catheter was placed though the  epidural was not dosed.  She was placed in the supine position with a  left lateral tilt.  The abdomen and perineum were prepped with multiple  layers of Betadine and a Foley catheter inserted  into the bladder under  sterile conditions and connected to straight drainage.  The abdomen was  draped as a sterile field.  After assurance of adequate surgical  anesthesia, the site of the previous cesarean section incision was  infiltrated with 20 mL of 0.25% Marcaine.  A suprapubic incision was  made in that side of the abdomen opened in layers.  The peritoneum was  entered and the bladder blade placed.  The uterus was incised  approximately 2 cm above the uterovesical fold and that incision taken  laterally on either side bluntly.  The membranes were ruptured and the  infant delivered from the occiput transverse position after the  reduction of a loose nuchal cord.  The nares and pharynx were suctioned  and the cord clamped and cut.  The infant was handed off to the awaiting  pediatricians.  The placenta was allowed to separate from the uterus and  was removed from the operative field.  The uterine incision was closed  with a running interlocking suture of 0 Vicryl after the cervix was  dilated with the sponge stick.  A second imbricating suture of 0 Vicryl  was then placed with adequate hemostasis.  Copious irrigation was  carried out.  The left fallopian tube was identified, followed to its  fimbriated end, then grasped at the isthmic portion and elevated.  A  suture of 2-0 chromic was placed through the mesosalpinx and tied fore  and aft on the knuckle of tube.  A second ligature was placed proximal  to that and the intervening knuckle of tube excised.  The cut ends were  cauterized and hemostasis noted to be adequate.  A similar procedure was  carried out on the opposite side.  The abdominal peritoneum was closed  with a running suture of 2-0 Vicryl.  The rectus muscles were  reapproximated in the midline with a figure-of-eight suture of 2-0  Vicryl.  The rectus muscles were made hemostatic with Bovie cautery and  irrigated.  The rectus fascia was closed with a running suture of  0  Vicryl, then reinforced on either side of midline with a figure-of-eight  suture of 0 Vicryl.  The subcutaneous tissue was made hemostatic with  Bovie cautery and irrigated.  The subcutaneous tissue was reapproximated  with a running suture of 0 plain.  The skin incision was closed with a  subcuticular suture of 3-0 Monocryl.  Steri-Strips were applied and a  sterile dressing applied.  The patient was taken from the operating room  to the recovery room in satisfactory condition having tolerated the  procedure well with sponge and instrument counts correct.   SPECIMENS TO PATHOLOGY:  Placenta.   The infant went to the full-term nursery.      Hal Morales, M.D.  Electronically Signed     VPH/MEDQ  D:  10/08/2007  T:  10/09/2007  Job:  [id number]

## 2010-05-30 NOTE — H&P (Signed)
Jo Chung, Jo Chung               ACCOUNT NO.:  [id number]   MEDICAL RECORD NO.:  [id number]          PATIENT TYPE:  INP   LOCATION:  9371                          FACILITY:  [facility]   PHYSICIAN:  Hal Morales, M.D.DATE OF BIRTH:  1965/04/16   DATE OF ADMISSION:  10/08/2007  DATE OF DISCHARGE:                              HISTORY & PHYSICAL   Ms. Jo Chung is a 45 year old gravida 6, para 4-0-1-3 at 35-5/7 weeks who  presented from the office for cesarean section secondary to previous C-  section, preeclampsia versus gestational hypertension today and for  history of previous IUFD.  Her pregnancy has been remarkable for:   1. Advanced maternal age.  2. Grand multiparous.  3. Previous C-section x2 with two subsequent VBACs.  4. Hypothyroidism.  5. History of preeclampsia.  6. Lupus.  7. Sarcoidosis.  8. Depression.  9. Breast reduction.  10.Anti-Kell antibody with fourth pregnancy and a subsequent IUFD.  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  12.IUFD with abruption.  13.History of pyelectasis with a history of ureteral obstruction.  14.Fetal tricuspid regurgitation on fetal echo with the baby needing a      postnatal echo if a murmur is auscultated.  15.Desires tubal.  16.Two C-sections with two subsequent VBACs but desire for repeat      cesarean section today.   PRENATAL LABS:  Blood type is A positive, Rh antibody is positive for  Kell, sickle cell test was negative, RPR is nonreactive.  Rubella titer  is immune.  Hepatitis B surface antigen negative.  HIV nonreactive.  Her  anti-Kell antibody was 2 at her initial visit.  It had been 8 in 1999.  Pap was normal.  GC and chlamydia cultures were negative at her first  visit.  First trimester screen was normal.  She declined amnio.  UA  showed a positive culture at 15 weeks.  She was treated with Macrobid.  UTI symptoms were resolved.  She had a 1-hour Glucola that was elevated  at 156.  She had a 3-hour GTT and 3-hour  GTT was normal.  She had a low  thyroid level at one point.  TSH done at 26 weeks was normal.  Anti-Kell  titer at 26 weeks was 8.  Hemoglobin was 10.4 at 28 weeks.  Her 24-hour  urine at 26 weeks was within normal limits.  Group B strep culture was  not done prior to her delivery.   HISTORY OF PRESENT PREGNANCY:  The patient entered care at approximately  9 weeks.  She had an ultrasound at that first visit for dating purposes.  Routine labs were done.  Anti-Kell antibody was 2.  It had been 8 in  1999.  She had a first trimester screen.  She was initially desiring  VBAC.  She had another first trimester screen at 13-6/7 weeks.  She had  a positive UTI at 14 weeks and was treated with Macrobid.  She had an  AFP that was normal.  She had an anatomy ultrasound at 20 weeks showing  normal growth.  Down syndrome risk at that time was 1 in  1648.  She was  having some headaches.  She was given Vicodin p.r.n., but declined a  referral to neurologist.  She had some limitation of ultrasound at 20  weeks so she had a repeat at 21 weeks showing normal growth.  She was  begun on maternity leave at 20 weeks secondary to extreme fatigue and  minimal weight gain.  Her rheumatologist at that time also recommended  prednisone.  She had a cold that began about 21 weeks which was treated  symptomatically.  At 24 weeks she was still having some issues with the  URI but it was clearing.  She was on prednisone 2.5 mg daily beginning  at 24 weeks.  There was at that time the previously noted diagnosis of  sarcoidosis but then was also diagnosed with likely lupus at 24 weeks.  She had a 24-hour urine at 26 weeks that was normal, normal creatinine.  She had a fetal echo done at 26 weeks showing trivial tricuspid  regurgitation and the plan was made to repeat that at 6 weeks  subsequently.  She did have positive Kell antibody.  This was checked  again at 26 weeks.  She had a somewhat low thyroid level.  She had a  TSH  at 26 weeks.  Plaquenil was also discussed by her rheumatologist.  This  was started at approximately 29 weeks.  She had an elevated 1-hour  Glucola of 156.  A 3-hour GTT was done.  She also had an anti-Kell titer  at approximately 27 weeks of 8.  TSH was normal at 0.766.  At 29 weeks  she had another ultrasound showing growth of greater than 90th  percentile, normal cervical length and normal fluid.  Recommendations  from maternal fetal medicine at 29 weeks was to check the father of the  baby for anti-Kell.  Plan was to continue to follow up with Dopplers in  maternal fetal medicine, NSTs twice weekly.  She had significant nausea  and vomiting at 29 weeks.  Kell antibody at that time was 1.8.  She was  having weekly Dopplers with maternal fetal medicine.  Cardiologist had  evaluated her heart and felt it was fine.  By 29 weeks she was desiring  a repeat C-section and tubal sterilization.  She was also placed on  Zofran.  At 31 weeks she had normal BPP, Dopplers and growth.  Fluid at  32 weeks was at the 80th percentile.  BPP was 8 and Dopplers were  normal.  By 33 weeks she was on Plaquenil 200 mg p.o. b.i.d. but was  only taking it once a day, prednisone 5 mg daily, Synthroid, Vicodin,  Zofran and Protonix.  She was only able to take the Plaquenil every day  due to nausea.  She had an ultrasound at 33 weeks also showing growth at  the 89th percentile, normal fluid, normal Dopplers and a BPP of 8/8.  Echocardiogram was done on the fetus at 34 weeks and this did show the  tricuspid regurgitation.  Plan was made to follow up postnatally with  the pediatrician in the nursery if murmur is noted.  At 34-6/7 weeks she  had an ultrasound showing normal growth, normal Dopplers and normal BPP.  Prednisone was increased to 10 mg per day to help with leg pain.  She  was unable to take the full dose by Plaquenil and the decision was made  that she would need a steroid boost for the C-section.   She was  seen in  the office on September 23 and had 2+ protein on a voided specimen, 3+  on a catheterized specimen and a blood pressure of 140/90.  At that time  she was admitted to the hospital for a C-section.  She did have some  nausea and vomiting at that time too.   OBSTETRICAL HISTORY:  In 1991 she had a primary low transverse cesarean  section for a female infant, weight 7 pounds at 40 weeks.  She did have  some type of anesthesia.  She had a stillbirth that was due to  preeclampsia abruption.  She did go into DIC.  In 1992 she had a repeat  C-section for a female infant, weight 7 pounds 12 ounces at 37 weeks.  She  had no complications.  In 1997 she had a VBAC of a female infant, weight  6 pounds 5 ounces at 37-5/7 weeks.  She was in labor 24 hours.  She had  epidural anesthesia.  That baby was delivered by Dr. Stefano Gaul.  She was  induced for nonreassuring fetal heart rate.  In 1999 she had a vaginal  birth after cesarean for a female infant, weight 6 pounds 5 ounces at 37-  6/7 weeks.  She was in labor for 4 hours.  That child was delivered by  Dr. Stefano Gaul with an epidural anesthesia.  She had positive anti-Kell  antibodies noted at that time.  She was induced.  She had lost a lot of  weight during that pregnancy.  In 2008 she had a miscarriage at 9 weeks.  With all pregnancies she was on iron supplementation.  She did have a  previously noted stillbirth history with her first pregnancy.  She did  have first trimester bleeding with her second pregnancy and DIC with her  first pregnancy.  I believe that she also had preeclampsia with that  pregnancy.  She did have group B strep with her fourth pregnancy.   MEDICAL HISTORY:  She was on previous birth control pill, ParaGard and  Depo-Provera user.  She has occasional yeast infections.  She reports  the usual childhood illnesses.  She has been vaccinated against  hepatitis and she had varicella as a child.  She does have a history  of  anemia with pregnancies.  In 1992 she had a blood transfusion after her  first delivery.  She has hypothyroidism and has been on Synthroid prior  to pregnancy.  That dose was 0.112 mg.  She has had occasional UTIs.  She had pyelo during one of her pregnancies.  She was diagnosed with  sarcoidosis in 2000.  She does suffer from depression but is not on any  medication.  She has been treated by Dr. Evelene Croon with Zoloft in the past.   SURGICAL HISTORY:  Includes breast reduction in 1991.  She had a  urethral pelvic junction obstruction in 1998 and had surgery for that.  At 45 years old she had fibrous tissue removed.  She was also  hospitalized previously for pyelonephritis during her pregnancy.   FAMILY HISTORY:  Her mother has coronary artery disease.  Her parents,  paternal grandmother, paternal grandfather, maternal grandmother and  maternal grandfather all have hypertension.  Her mother has anemia.  Her  mom has COPD.  Her father has emphysema.  Her mother has history of  hyperthyroidism.  Sister has MS.  Maternal grandfather has prostate  cancer.  Her mom had cervical cancer.  Her mother, father and brother  all suffer  from depression.  Her parents are alcoholics.   GENETIC HISTORY:  Is remarkable for the following, the baby's first  cousin having mental retardation.  The father of the baby's nephew has  hydrocephalus that was a twin preemie.  The patient does have history of  anti-Kell antibody with her fourth pregnancy identified.  She has a  history of a stillbirth with placental abruption in her first pregnancy.   SOCIAL HISTORY:  The patient is married to the father of baby.  He is  involved and supportive.  His name is Kimanh Templeman.  The patient is  [place] and of [address] faith.  She has a BS in nursing.  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  Her husband has an associate  degree in textiles.  The patient has no known medication allergies.  The  patient  denies any alcohol, drug or tobacco use or illicit drug use  during this pregnancy.   PHYSICAL EXAMINATION:  VITAL SIGNS:  Blood pressure is 143/88, other  vital signs are stable.  HEENT:  Within normal limits.  LUNGS:  Breath sounds are clear.  HEART:  Regular rate and rhythm without murmur.  BREASTS:  Soft and nontender.  ABDOMEN:  Fundal height is approximately 30 cm.  Estimated fetal weight  is 6 pounds.  Uterine contractions are very occasional and mild.  PELVIC:  Exam is deferred.  EXTREMITIES:  Deep tendon reflexes are 2+ without clonus.  There is a 1+  edema noted in the lower extremities.   PIH labs today.  Hemoglobin is 10.1, hematocrit 30.8, platelet count 206  and white blood cell count of 7.5.  Uric acid was 5.4.  LDH was 120.  Comprehensive metabolic panel - sodium is 136, potassium 3.3, chloride  97, CO2 23, glucose 83, BUN 3, creatinine 0.42, SGOT 17, SGPT 16.  The  patient was typed and crossed for 2 units of packed red blood cells and  they were screened for, the patient was screened for the antibody and  she did have anti-Kell.   ASSESSMENT:  1. Intrauterine pregnancy at 35-5/7 weeks.  2. Lupus.  3. Gestational hypertension versus probable preeclampsia.  4. 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  5. Sarcoidosis.  6. Previous C-section x2 with subsequent 2 VBACs (vaginal birth after      cesareans), however, the patient desires C-section and tubal      sterilization today.   PLAN:  1. Admit to [facility] for consult with Dr. Dierdre Forth as attending physician.  2. Routine physician preoperative orders.  3. Anesthesia consult for lupus and sarcoidosis.  4. Steroid boost will be determined by Dr. Pennie Rushing.  5. Desires tubal sterilization.  6. Will be on magnesium sulfate and anticipate it for 24 hours      subsequent to delivery.      Renaldo Reel Emilee Hero, C.N.M.      ______________________________  Hal Morales,  M.D.    VLL/MEDQ  D:  10/09/2007  T:  10/09/2007  Job:  [id number]

## 2010-05-30 NOTE — Assessment & Plan Note (Signed)
[facility]                            CARDIOLOGY OFFICE NOTE   Jo Chung, Jo Chung                        MRN:          [id number]  DATE:08/25/2007                            DOB:          01/10/1966    REFERRING PHYSICIAN:  Hal Morales, M.D.   HISTORY OF PRESENT ILLNESS:  This is a 45 year old with no prior cardiac  problems who presents for evaluation of palpitations.  She is now [redacted]  weeks pregnant.  She does have a history of recently diagnosed lupus,  cutaneous sarcoidosis, and hypothyroidism.  I saw her in the clinic  about a week ago for significant palpitations in the setting of  pregnancy.  She states that the palpitations are somewhat better this  week, now that 2 of her children are away on vacation and she feels like  she is under less stress.  She does state that the palpitations are more  common if she is moving around, exerting herself, and less if she is at  rest.  We did check her thyroid indices, which were normal.  We checked  an echocardiogram, which was unremarkable and she did have a Holter  monitor, which showed that these palpitations were likely due to  premature atrial contractions.  The patient denies any chest pain or  shortness of breath.   PAST MEDICAL HISTORY:  1. Systemic lupus erythematosus as diagnosed at this pregnancy.  Her      symptoms are arthritis and muscle pain.  2. Sarcoidosis.  This is primary manifested cutaneously.  She has no      history of pulmonary sarcoidosis.  3. Hypothyroidism.  Thyroid indices checked in July 2009 showed normal      TSH, normal free T3, normal T4.  4. Palpitations.  Workup with palpitations includes a Holter monitor      done in August 2009 showing occasional premature atrial      contractions.  Additionally, she had an echocardiogram done in      August 2009 showing normal left ventricular size, and normal      ejection fraction of 70%, no regional wall motion  abnormalities,      normal RV size and function and normal valves.   MEDICATIONS:  The patient is taking Synthroid 112 mcg daily and a  multivitamin.  The patient is taking Plaquenil 200 mg b.i.d. and  prednisone 5 mg daily.   SOCIAL HISTORY:  The patient lives with her husband.  She has 3  children.  Does not smoke, does not use illicit drugs, and does not  drink alcohol.   PHYSICAL EXAMINATION:  VITAL SIGNS:  Today, blood pressure is 115/70,  heart rate is 82, weight is 172 pounds.  GENERAL:  No apparent distress.  This is a gravid female.  NECK:  No JVD.  LUNGS:  Clear to auscultation bilaterally.  HEART:  Regular S1, S2.  There is a 2/6 systolic ejection murmur at the  upper sternal murmur.  No S3.  No S4.  EXTREMITIES:  No peripheral edema.  ABDOMEN:  Gravid and nontender.   ASSESSMENT  AND PLAN:  This is a 45 year old with a history of recently  diagnosed lupus, cutaneous sarcoidosis, hypothyroidism, presented to  Cardiology Clinic for evaluation of palpitations.  1. Palpitations.  These are likely premature atrial contractions.      These were noted on Holter monitor.  Likely, these have been      increased in the setting of pregnancy and hopefully will decrease      once she has delivered.  Also, her TSH and free T4 were normal      suggesting her hypothyroidism is under adequate control.  2. Echocardiogram showed normal left ventricle size and function,      normal right ventricle size and function.  Normal valves.  3. The patient can follow up in Cardiology Clinic p.r.n., but she does      not need to be seen back necessarily at this time.     Marca Ancona, MD  Electronically Signed    DM/MedQ  DD: 08/25/2007  DT: 08/26/2007  Job #: [id number]   cc:   Rollene Rotunda, MD, [facility]

## 2010-06-02 NOTE — Op Note (Signed)
[facility]  Patient:    Jo Chung, Jo Chung                           MRN: [id number] Proc. Date: 05/15/99 Adm. Date:  [id number] Attending:  Gustavus Messing CC:         Yaakov Guthrie. Shon Hough, M.D. x 2                           Operative Report  HISTORY:  A 45 year old lady with severe macromastia, back and shoulder pain secondary to large pendulous breasts.  PROCEDURES PERFORMED:  Bilateral breast reductions using the inferior pedicle technique.  ATTENDING:  Yaakov Guthrie. Shon Hough, M.D.  ASSISTANT:  Margaretha Sheffield.  ANESTHESIA:  General.  DESCRIPTION OF PROCEDURE:  Preoperatively, the patient was sat up and drawn off for the reduction mammoplasty inferior pedicle technique, remarking the nipple-areolar complex to 20 cm from the suprasternal notch.  She then underwent general anesthesia, intubated orally.  Prep was done to the chest, breast areas in the routine fashion using Betadine soap and solution and walled off with sterile towels and drapes so as to make a sterile field. Then, 0.25% Xylocaine with epinephrine was injected locally, 1:400,000 concentration, total of 150 cc per side.  The wounds were scored with #15 blades.  Then, the skin over the inferior pedicle was deepithelialized with a #20 blades.  The medial and lateral fatty dermal pedicles were excised down to underlying fascia.  After that, more tissue was taken laterally to improve contour.  The new keyhole area was also debulked.  After proper hemostasis, the flaps were transposed and stayed with 3-0 Prolene.  Subcutaneously closure was done with 3-0 Monocryl x 2 layers and a running subcuticular stitch of 3-0 Monocryl and 5-0 Monocryl throughout the inverted T.  The wounds were drained with #10 Blake drains, which were placed in the depths of the wound and brought out through the lateral most portion of the incision, secured with 3-0 Prolene.  The wounds were cleansed, Steri-Strips  and soft dressings were applied to all areas.  The soft dressings were taped with Hypafix.  She withstood the procedure very well, was taken to recovery in excellent condition. DD:  05/15/99 TD:  05/15/99 Job: [id number]

## 2010-09-22 ENCOUNTER — Other Ambulatory Visit: Payer: Self-pay | Admitting: Gastroenterology

## 2010-10-16 LAB — CBC
HCT: 30.8 — ABNORMAL LOW
Hemoglobin: 9.1 — ABNORMAL LOW
MCHC: 32.6
MCV: 78.7
RBC: 3.99
RBC: 4.24
RDW: 16.6 — ABNORMAL HIGH
RDW: 17 — ABNORMAL HIGH
WBC: 17.1 — ABNORMAL HIGH
WBC: 7.5

## 2010-10-16 LAB — COMPREHENSIVE METABOLIC PANEL
ALT: 14
ALT: 17
AST: 17
AST: 23
Albumin: 2.4 — ABNORMAL LOW
BUN: 3 — ABNORMAL LOW
CO2: 22
Calcium: 6.8 — ABNORMAL LOW
Calcium: 7.4 — ABNORMAL LOW
Calcium: 8.2 — ABNORMAL LOW
Chloride: 103
Chloride: 107
Creatinine, Ser: 0.55
GFR calc Af Amer: >60
GFR calc non Af Amer: >60
GFR calc non Af Amer: >60
Glucose, Bld: 85
Potassium: 3.3 — ABNORMAL LOW
Sodium: 131 — ABNORMAL LOW
Sodium: 140
Total Bilirubin: 0.5
Total Bilirubin: 0.6
Total Protein: 4.7 — ABNORMAL LOW
Total Protein: 5.4 — ABNORMAL LOW

## 2010-10-16 LAB — CROSSMATCH
DAT, IgG: NEGATIVE
PT AG Type: NEGATIVE

## 2010-10-31 ENCOUNTER — Other Ambulatory Visit: Payer: Self-pay | Admitting: Gastroenterology

## 2011-05-29 ENCOUNTER — Other Ambulatory Visit: Payer: Self-pay | Admitting: Gastroenterology

## 2012-02-08 ENCOUNTER — Other Ambulatory Visit: Payer: Self-pay | Admitting: Gastroenterology

## 2013-05-07 ENCOUNTER — Ambulatory Visit: Payer: PRIVATE HEALTH INSURANCE | Attending: Psychiatry

## 2013-05-07 ENCOUNTER — Encounter: Payer: Self-pay | Admitting: Speech Pathology

## 2013-05-07 DIAGNOSIS — Z8673 Personal history of transient ischemic attack (TIA), and cerebral infarction without residual deficits: Secondary | ICD-10-CM | POA: Insufficient documentation

## 2013-05-07 DIAGNOSIS — R279 Unspecified lack of coordination: Secondary | ICD-10-CM | POA: Insufficient documentation

## 2013-05-07 DIAGNOSIS — R262 Difficulty in walking, not elsewhere classified: Secondary | ICD-10-CM | POA: Insufficient documentation

## 2013-05-07 DIAGNOSIS — Z5189 Encounter for other specified aftercare: Secondary | ICD-10-CM | POA: Insufficient documentation

## 2013-05-08 ENCOUNTER — Ambulatory Visit: Payer: PRIVATE HEALTH INSURANCE | Admitting: Occupational Therapy

## 2013-05-12 ENCOUNTER — Ambulatory Visit: Payer: PRIVATE HEALTH INSURANCE | Admitting: Occupational Therapy

## 2013-05-14 ENCOUNTER — Ambulatory Visit: Payer: PRIVATE HEALTH INSURANCE | Admitting: Occupational Therapy

## 2013-05-18 ENCOUNTER — Ambulatory Visit: Payer: PRIVATE HEALTH INSURANCE | Admitting: Physical Therapy

## 2013-05-18 ENCOUNTER — Ambulatory Visit: Payer: PRIVATE HEALTH INSURANCE | Attending: Psychiatry | Admitting: Occupational Therapy

## 2013-05-18 DIAGNOSIS — Z8673 Personal history of transient ischemic attack (TIA), and cerebral infarction without residual deficits: Secondary | ICD-10-CM | POA: Diagnosis not present

## 2013-05-18 DIAGNOSIS — R262 Difficulty in walking, not elsewhere classified: Secondary | ICD-10-CM | POA: Diagnosis not present

## 2013-05-18 DIAGNOSIS — Z5189 Encounter for other specified aftercare: Secondary | ICD-10-CM | POA: Insufficient documentation

## 2013-05-18 DIAGNOSIS — R279 Unspecified lack of coordination: Secondary | ICD-10-CM | POA: Insufficient documentation

## 2013-05-22 ENCOUNTER — Ambulatory Visit: Payer: PRIVATE HEALTH INSURANCE | Admitting: Occupational Therapy

## 2013-05-22 ENCOUNTER — Ambulatory Visit: Payer: PRIVATE HEALTH INSURANCE | Admitting: Physical Therapy

## 2013-05-22 DIAGNOSIS — Z5189 Encounter for other specified aftercare: Secondary | ICD-10-CM | POA: Diagnosis not present

## 2013-05-25 ENCOUNTER — Ambulatory Visit: Payer: PRIVATE HEALTH INSURANCE | Admitting: Physical Therapy

## 2013-05-25 ENCOUNTER — Ambulatory Visit: Payer: PRIVATE HEALTH INSURANCE | Admitting: Occupational Therapy

## 2013-05-25 DIAGNOSIS — Z5189 Encounter for other specified aftercare: Secondary | ICD-10-CM | POA: Diagnosis not present

## 2013-05-26 ENCOUNTER — Ambulatory Visit: Payer: PRIVATE HEALTH INSURANCE

## 2013-05-26 ENCOUNTER — Ambulatory Visit: Payer: PRIVATE HEALTH INSURANCE | Admitting: Occupational Therapy

## 2013-05-26 DIAGNOSIS — Z5189 Encounter for other specified aftercare: Secondary | ICD-10-CM | POA: Diagnosis not present

## 2013-06-01 ENCOUNTER — Ambulatory Visit: Payer: PRIVATE HEALTH INSURANCE | Admitting: Physical Therapy

## 2013-06-01 ENCOUNTER — Encounter: Payer: PRIVATE HEALTH INSURANCE | Admitting: Occupational Therapy

## 2013-06-03 ENCOUNTER — Ambulatory Visit: Payer: PRIVATE HEALTH INSURANCE

## 2013-06-03 ENCOUNTER — Ambulatory Visit: Payer: PRIVATE HEALTH INSURANCE | Admitting: Occupational Therapy

## 2013-06-03 DIAGNOSIS — Z5189 Encounter for other specified aftercare: Secondary | ICD-10-CM | POA: Diagnosis not present

## 2013-06-09 ENCOUNTER — Ambulatory Visit: Payer: PRIVATE HEALTH INSURANCE | Admitting: Occupational Therapy

## 2013-06-09 ENCOUNTER — Ambulatory Visit: Payer: PRIVATE HEALTH INSURANCE | Admitting: Physical Therapy

## 2013-06-09 DIAGNOSIS — Z5189 Encounter for other specified aftercare: Secondary | ICD-10-CM | POA: Diagnosis not present

## 2013-06-11 ENCOUNTER — Ambulatory Visit: Payer: PRIVATE HEALTH INSURANCE | Admitting: Occupational Therapy

## 2013-06-11 ENCOUNTER — Ambulatory Visit: Payer: PRIVATE HEALTH INSURANCE | Admitting: Physical Therapy

## 2013-06-11 DIAGNOSIS — Z5189 Encounter for other specified aftercare: Secondary | ICD-10-CM | POA: Diagnosis not present

## 2013-06-15 ENCOUNTER — Ambulatory Visit: Payer: PRIVATE HEALTH INSURANCE | Attending: Psychiatry

## 2013-06-15 ENCOUNTER — Ambulatory Visit: Payer: PRIVATE HEALTH INSURANCE | Admitting: Occupational Therapy

## 2013-06-15 DIAGNOSIS — R262 Difficulty in walking, not elsewhere classified: Secondary | ICD-10-CM | POA: Insufficient documentation

## 2013-06-15 DIAGNOSIS — Z5189 Encounter for other specified aftercare: Secondary | ICD-10-CM | POA: Diagnosis present

## 2013-06-15 DIAGNOSIS — Z8673 Personal history of transient ischemic attack (TIA), and cerebral infarction without residual deficits: Secondary | ICD-10-CM | POA: Insufficient documentation

## 2013-06-15 DIAGNOSIS — R279 Unspecified lack of coordination: Secondary | ICD-10-CM | POA: Insufficient documentation

## 2013-06-18 ENCOUNTER — Ambulatory Visit: Payer: PRIVATE HEALTH INSURANCE | Admitting: Occupational Therapy

## 2013-06-18 ENCOUNTER — Ambulatory Visit: Payer: PRIVATE HEALTH INSURANCE

## 2013-06-18 DIAGNOSIS — Z5189 Encounter for other specified aftercare: Secondary | ICD-10-CM | POA: Diagnosis not present

## 2013-06-22 ENCOUNTER — Ambulatory Visit: Payer: PRIVATE HEALTH INSURANCE | Admitting: Occupational Therapy

## 2013-06-22 ENCOUNTER — Ambulatory Visit: Payer: PRIVATE HEALTH INSURANCE

## 2013-06-24 ENCOUNTER — Ambulatory Visit: Payer: PRIVATE HEALTH INSURANCE | Admitting: Occupational Therapy

## 2013-06-24 ENCOUNTER — Ambulatory Visit: Payer: PRIVATE HEALTH INSURANCE | Admitting: Physical Therapy

## 2013-06-24 DIAGNOSIS — Z5189 Encounter for other specified aftercare: Secondary | ICD-10-CM | POA: Diagnosis not present

## 2013-06-30 ENCOUNTER — Ambulatory Visit: Payer: PRIVATE HEALTH INSURANCE | Admitting: Physical Therapy

## 2013-06-30 ENCOUNTER — Ambulatory Visit: Payer: PRIVATE HEALTH INSURANCE | Admitting: Occupational Therapy

## 2013-06-30 DIAGNOSIS — Z5189 Encounter for other specified aftercare: Secondary | ICD-10-CM | POA: Diagnosis not present

## 2013-07-01 ENCOUNTER — Ambulatory Visit: Payer: PRIVATE HEALTH INSURANCE | Admitting: Occupational Therapy

## 2013-07-01 ENCOUNTER — Ambulatory Visit: Payer: PRIVATE HEALTH INSURANCE

## 2013-07-01 DIAGNOSIS — Z5189 Encounter for other specified aftercare: Secondary | ICD-10-CM | POA: Diagnosis not present

## 2013-07-08 ENCOUNTER — Ambulatory Visit: Payer: PRIVATE HEALTH INSURANCE | Admitting: Physical Therapy

## 2013-07-08 DIAGNOSIS — Z5189 Encounter for other specified aftercare: Secondary | ICD-10-CM | POA: Diagnosis not present

## 2013-07-10 ENCOUNTER — Ambulatory Visit: Payer: PRIVATE HEALTH INSURANCE

## 2013-07-10 DIAGNOSIS — Z5189 Encounter for other specified aftercare: Secondary | ICD-10-CM | POA: Diagnosis not present

## 2013-07-14 ENCOUNTER — Ambulatory Visit: Payer: PRIVATE HEALTH INSURANCE

## 2013-07-14 ENCOUNTER — Ambulatory Visit: Payer: PRIVATE HEALTH INSURANCE | Admitting: Occupational Therapy

## 2013-07-14 DIAGNOSIS — Z5189 Encounter for other specified aftercare: Secondary | ICD-10-CM | POA: Diagnosis not present

## 2013-07-16 ENCOUNTER — Encounter: Payer: PRIVATE HEALTH INSURANCE | Admitting: Occupational Therapy

## 2013-07-16 ENCOUNTER — Ambulatory Visit: Payer: PRIVATE HEALTH INSURANCE | Admitting: Physical Therapy

## 2013-07-20 ENCOUNTER — Ambulatory Visit: Payer: PRIVATE HEALTH INSURANCE

## 2013-07-20 ENCOUNTER — Encounter: Payer: PRIVATE HEALTH INSURANCE | Admitting: Occupational Therapy

## 2013-07-22 ENCOUNTER — Ambulatory Visit: Payer: PRIVATE HEALTH INSURANCE | Attending: Psychiatry

## 2013-07-22 ENCOUNTER — Ambulatory Visit: Payer: PRIVATE HEALTH INSURANCE | Admitting: Occupational Therapy

## 2013-07-22 DIAGNOSIS — Z5189 Encounter for other specified aftercare: Secondary | ICD-10-CM | POA: Diagnosis present

## 2013-07-22 DIAGNOSIS — R262 Difficulty in walking, not elsewhere classified: Secondary | ICD-10-CM | POA: Diagnosis not present

## 2013-07-22 DIAGNOSIS — R279 Unspecified lack of coordination: Secondary | ICD-10-CM | POA: Insufficient documentation

## 2013-07-22 DIAGNOSIS — Z8673 Personal history of transient ischemic attack (TIA), and cerebral infarction without residual deficits: Secondary | ICD-10-CM | POA: Diagnosis not present

## 2013-07-27 ENCOUNTER — Ambulatory Visit: Payer: PRIVATE HEALTH INSURANCE | Admitting: Occupational Therapy

## 2013-07-27 ENCOUNTER — Ambulatory Visit: Payer: PRIVATE HEALTH INSURANCE

## 2013-07-27 DIAGNOSIS — Z5189 Encounter for other specified aftercare: Secondary | ICD-10-CM | POA: Diagnosis not present

## 2013-07-29 ENCOUNTER — Encounter: Payer: PRIVATE HEALTH INSURANCE | Admitting: Occupational Therapy

## 2013-07-29 ENCOUNTER — Ambulatory Visit: Payer: PRIVATE HEALTH INSURANCE | Admitting: Physical Therapy

## 2013-07-29 ENCOUNTER — Ambulatory Visit: Payer: PRIVATE HEALTH INSURANCE

## 2013-07-29 ENCOUNTER — Ambulatory Visit: Payer: PRIVATE HEALTH INSURANCE | Admitting: Occupational Therapy

## 2013-07-29 DIAGNOSIS — Z5189 Encounter for other specified aftercare: Secondary | ICD-10-CM | POA: Diagnosis not present

## 2013-08-04 ENCOUNTER — Ambulatory Visit: Payer: PRIVATE HEALTH INSURANCE | Admitting: Physical Therapy

## 2013-08-04 ENCOUNTER — Ambulatory Visit: Payer: PRIVATE HEALTH INSURANCE | Admitting: Occupational Therapy

## 2013-08-04 DIAGNOSIS — Z5189 Encounter for other specified aftercare: Secondary | ICD-10-CM | POA: Diagnosis not present

## 2013-08-06 ENCOUNTER — Ambulatory Visit: Payer: PRIVATE HEALTH INSURANCE | Admitting: Occupational Therapy

## 2013-08-06 DIAGNOSIS — Z5189 Encounter for other specified aftercare: Secondary | ICD-10-CM | POA: Diagnosis not present

## 2014-11-07 ENCOUNTER — Encounter (HOSPITAL_COMMUNITY): Payer: Self-pay | Admitting: Vascular Surgery

## 2014-11-07 ENCOUNTER — Observation Stay (HOSPITAL_COMMUNITY)
Admission: EM | Admit: 2014-11-07 | Discharge: 2014-11-08 | Disposition: A | Discharge status: Home / Self Care | Payer: PRIVATE HEALTH INSURANCE | Attending: Internal Medicine | Admitting: Internal Medicine

## 2014-11-07 ENCOUNTER — Emergency Department (HOSPITAL_COMMUNITY): Payer: PRIVATE HEALTH INSURANCE

## 2014-11-07 DIAGNOSIS — E538 Deficiency of other specified B group vitamins: Secondary | ICD-10-CM | POA: Insufficient documentation

## 2014-11-07 DIAGNOSIS — G4700 Insomnia, unspecified: Secondary | ICD-10-CM | POA: Diagnosis not present

## 2014-11-07 DIAGNOSIS — R29810 Facial weakness: Secondary | ICD-10-CM | POA: Diagnosis not present

## 2014-11-07 DIAGNOSIS — R4781 Slurred speech: Secondary | ICD-10-CM | POA: Insufficient documentation

## 2014-11-07 DIAGNOSIS — Z8639 Personal history of other endocrine, nutritional and metabolic disease: Secondary | ICD-10-CM | POA: Diagnosis not present

## 2014-11-07 DIAGNOSIS — M797 Fibromyalgia: Secondary | ICD-10-CM | POA: Diagnosis not present

## 2014-11-07 DIAGNOSIS — F419 Anxiety disorder, unspecified: Secondary | ICD-10-CM | POA: Diagnosis not present

## 2014-11-07 DIAGNOSIS — E876 Hypokalemia: Secondary | ICD-10-CM | POA: Diagnosis not present

## 2014-11-07 DIAGNOSIS — R531 Weakness: Secondary | ICD-10-CM | POA: Insufficient documentation

## 2014-11-07 DIAGNOSIS — L821 Other seborrheic keratosis: Secondary | ICD-10-CM | POA: Diagnosis not present

## 2014-11-07 DIAGNOSIS — K529 Noninfective gastroenteritis and colitis, unspecified: Secondary | ICD-10-CM

## 2014-11-07 DIAGNOSIS — Z7982 Long term (current) use of aspirin: Secondary | ICD-10-CM | POA: Insufficient documentation

## 2014-11-07 DIAGNOSIS — I10 Essential (primary) hypertension: Secondary | ICD-10-CM | POA: Insufficient documentation

## 2014-11-07 DIAGNOSIS — I69354 Hemiplegia and hemiparesis following cerebral infarction affecting left non-dominant side: Secondary | ICD-10-CM | POA: Insufficient documentation

## 2014-11-07 DIAGNOSIS — E669 Obesity, unspecified: Secondary | ICD-10-CM | POA: Insufficient documentation

## 2014-11-07 DIAGNOSIS — Z6831 Body mass index (BMI) 31.0-31.9, adult: Secondary | ICD-10-CM | POA: Diagnosis not present

## 2014-11-07 DIAGNOSIS — K52832 Lymphocytic colitis: Secondary | ICD-10-CM | POA: Insufficient documentation

## 2014-11-07 DIAGNOSIS — I739 Peripheral vascular disease, unspecified: Secondary | ICD-10-CM | POA: Insufficient documentation

## 2014-11-07 DIAGNOSIS — M329 Systemic lupus erythematosus, unspecified: Secondary | ICD-10-CM | POA: Insufficient documentation

## 2014-11-07 DIAGNOSIS — G9389 Other specified disorders of brain: Secondary | ICD-10-CM | POA: Insufficient documentation

## 2014-11-07 DIAGNOSIS — D509 Iron deficiency anemia, unspecified: Secondary | ICD-10-CM | POA: Insufficient documentation

## 2014-11-07 DIAGNOSIS — E063 Autoimmune thyroiditis: Secondary | ICD-10-CM | POA: Insufficient documentation

## 2014-11-07 DIAGNOSIS — R200 Anesthesia of skin: Secondary | ICD-10-CM | POA: Diagnosis not present

## 2014-11-07 HISTORY — DX: Lymphocytic colitis: K52.832

## 2014-11-07 HISTORY — DX: Anxiety disorder, unspecified: F41.9

## 2014-11-07 HISTORY — DX: Deficiency of other specified B group vitamins: E53.8

## 2014-11-07 HISTORY — DX: Obesity, unspecified: E66.9

## 2014-11-07 HISTORY — DX: Other seborrheic keratosis: L82.1

## 2014-11-07 HISTORY — DX: Fibromyalgia: M79.7

## 2014-11-07 HISTORY — DX: Cerebral infarction, unspecified: I63.9

## 2014-11-07 HISTORY — DX: Erythema intertrigo: L30.4

## 2014-11-07 HISTORY — DX: Personal history of other endocrine, nutritional and metabolic disease: Z86.39

## 2014-11-07 HISTORY — DX: Insomnia, unspecified: G47.00

## 2014-11-07 HISTORY — DX: Essential (primary) hypertension: I10

## 2014-11-07 HISTORY — DX: Reserved for concepts with insufficient information to code with codable children: IMO0002

## 2014-11-07 HISTORY — DX: Systemic lupus erythematosus, unspecified: M32.9

## 2014-11-07 LAB — CBC
HCT: 41.1 % (ref 36.0–46.0)
Hemoglobin: 13.2 g/dL (ref 12.0–15.0)
MCH: 26.3 pg (ref 26.0–34.0)
MCHC: 32.1 g/dL (ref 30.0–36.0)
MCV: 81.9 fL (ref 78.0–100.0)
Platelets: 315 10*3/uL (ref 150–400)
RBC: 5.02 MIL/uL (ref 3.87–5.11)
RDW: 14.3 % (ref 11.5–15.5)
WBC: 7.8 10*3/uL (ref 4.0–10.5)

## 2014-11-07 LAB — COMPREHENSIVE METABOLIC PANEL
ALT: 15 U/L (ref 14–54)
AST: 29 U/L (ref 15–41)
Albumin: 3.4 g/dL — ABNORMAL LOW (ref 3.5–5.0)
Alkaline Phosphatase: 38 U/L (ref 38–126)
Anion gap: 14 (ref 5–15)
BUN: <5 mg/dL — ABNORMAL LOW (ref 6–20)
CO2: 31 mmol/L (ref 22–32)
Calcium: 7.9 mg/dL — ABNORMAL LOW (ref 8.9–10.3)
Chloride: 93 mmol/L — ABNORMAL LOW (ref 101–111)
Creatinine, Ser: 0.61 mg/dL (ref 0.44–1.00)
GFR calc Af Amer: >60 mL/min (ref >60)
GFR calc non Af Amer: >60 mL/min (ref >60)
Glucose, Bld: 127 mg/dL — ABNORMAL HIGH (ref 65–99)
Potassium: <2 mmol/L — CL (ref 3.5–5.1)
Sodium: 138 mmol/L (ref 135–145)
Total Bilirubin: 0.5 mg/dL (ref 0.3–1.2)
Total Protein: 6.5 g/dL (ref 6.5–8.1)

## 2014-11-07 LAB — I-STAT TROPONIN, ED: Troponin i, poc: 0 ng/mL (ref 0.00–0.08)

## 2014-11-07 LAB — PROTIME-INR
INR: 1.02 (ref 0.00–1.49)
Prothrombin Time: 13.6 seconds (ref 11.6–15.2)

## 2014-11-07 LAB — DIFFERENTIAL
Basophils Absolute: 0 10*3/uL (ref 0.0–0.1)
Basophils Relative: 0 %
Eosinophils Absolute: 0.1 10*3/uL (ref 0.0–0.7)
Eosinophils Relative: 1 %
Lymphocytes Relative: 19 %
Lymphs Abs: 1.5 10*3/uL (ref 0.7–4.0)
Monocytes Absolute: 0.3 10*3/uL (ref 0.1–1.0)
Monocytes Relative: 4 %
Neutro Abs: 5.9 10*3/uL (ref 1.7–7.7)
Neutrophils Relative %: 76 %

## 2014-11-07 LAB — TSH: TSH: 1.735 u[IU]/mL (ref 0.350–4.500)

## 2014-11-07 LAB — POTASSIUM: POTASSIUM: 2.3 mmol/L — AB (ref 3.5–5.1)

## 2014-11-07 LAB — MAGNESIUM: Magnesium: 1.7 mg/dL (ref 1.7–2.4)

## 2014-11-07 LAB — APTT: aPTT: 30 seconds (ref 24–37)

## 2014-11-07 MED ORDER — DICYCLOMINE HCL 10 MG PO CAPS
10.0000 mg | ORAL_CAPSULE | Freq: Three times a day (TID) | ORAL | Status: DC
  Administered 2014-11-07 – 2014-11-08 (×4): 10 mg via ORAL
  Filled 2014-11-07 (×4): qty 1

## 2014-11-07 MED ORDER — THYROID 60 MG PO TABS: 90.0000 mg | ORAL_TABLET | Freq: Every day | ORAL | Status: DC

## 2014-11-07 MED ORDER — POTASSIUM CHLORIDE CRYS ER 20 MEQ PO TBCR: 40.0000 meq | EXTENDED_RELEASE_TABLET | Freq: Once | ORAL | Status: DC

## 2014-11-07 MED ORDER — ENOXAPARIN SODIUM 40 MG/0.4ML ~~LOC~~ SOLN
40.0000 mg | SUBCUTANEOUS | Status: DC
  Filled 2014-11-07: qty 0.4

## 2014-11-07 MED ORDER — MAGNESIUM SULFATE 2 GM/50ML IV SOLN
2.0000 g | INTRAVENOUS | Status: AC
  Administered 2014-11-07: 2 g via INTRAVENOUS
  Filled 2014-11-07: qty 50

## 2014-11-07 MED ORDER — ZOLPIDEM TARTRATE 5 MG PO TABS
5.0000 mg | ORAL_TABLET | Freq: Every evening | ORAL | Status: DC | PRN
  Administered 2014-11-07: 5 mg via ORAL
  Filled 2014-11-07: qty 1

## 2014-11-07 MED ORDER — ACETAMINOPHEN 650 MG RE SUPP: 650.0000 mg | Freq: Four times a day (QID) | RECTAL | Status: DC | PRN

## 2014-11-07 MED ORDER — BACLOFEN 10 MG PO TABS
10.0000 mg | ORAL_TABLET | Freq: Four times a day (QID) | ORAL | Status: DC | PRN
  Filled 2014-11-07: qty 1

## 2014-11-07 MED ORDER — LISINOPRIL 20 MG PO TABS
20.0000 mg | ORAL_TABLET | Freq: Every day | ORAL | Status: DC
  Administered 2014-11-07 – 2014-11-08 (×2): 20 mg via ORAL
  Filled 2014-11-07 (×2): qty 1

## 2014-11-07 MED ORDER — THYROID 60 MG PO TABS
105.0000 mg | ORAL_TABLET | Freq: Every day | ORAL | Status: DC
  Administered 2014-11-08: 105 mg via ORAL
  Filled 2014-11-07 (×2): qty 2

## 2014-11-07 MED ORDER — VITAMIN D (ERGOCALCIFEROL) 1.25 MG (50000 UNIT) PO CAPS
50000.0000 [IU] | ORAL_CAPSULE | ORAL | Status: DC
  Administered 2014-11-08: 50000 [IU] via ORAL
  Filled 2014-11-07: qty 1

## 2014-11-07 MED ORDER — ASPIRIN EC 81 MG PO TBEC
81.0000 mg | DELAYED_RELEASE_TABLET | Freq: Every day | ORAL | Status: DC
  Administered 2014-11-08: 81 mg via ORAL
  Filled 2014-11-07: qty 1

## 2014-11-07 MED ORDER — ERGOCALCIFEROL 1.25 MG (50000 UT) PO CAPS: 50000.0000 [IU] | ORAL_CAPSULE | ORAL | Status: DC

## 2014-11-07 MED ORDER — SERTRALINE HCL 100 MG PO TABS
100.0000 mg | ORAL_TABLET | Freq: Every day | ORAL | Status: DC
  Administered 2014-11-08: 100 mg via ORAL
  Filled 2014-11-07: qty 1

## 2014-11-07 MED ORDER — ACETAMINOPHEN 325 MG PO TABS: 650.0000 mg | ORAL_TABLET | Freq: Four times a day (QID) | ORAL | Status: DC | PRN

## 2014-11-07 MED ORDER — POTASSIUM CHLORIDE 10 MEQ/100ML IV SOLN
10.0000 meq | INTRAVENOUS | Status: AC
  Administered 2014-11-07 (×3): 10 meq via INTRAVENOUS
  Filled 2014-11-07 (×3): qty 100

## 2014-11-07 MED ORDER — POTASSIUM CHLORIDE CRYS ER 20 MEQ PO TBCR
40.0000 meq | EXTENDED_RELEASE_TABLET | ORAL | Status: AC
  Administered 2014-11-07 – 2014-11-08 (×3): 40 meq via ORAL
  Filled 2014-11-07 (×4): qty 2

## 2014-11-07 MED ORDER — BUDESONIDE 3 MG PO CPEP
3.0000 mg | ORAL_CAPSULE | Freq: Every day | ORAL | Status: DC
  Administered 2014-11-08: 3 mg via ORAL
  Filled 2014-11-07: qty 1

## 2014-11-07 MED ORDER — OXYCODONE HCL 5 MG PO TABS
5.0000 mg | ORAL_TABLET | ORAL | Status: DC | PRN
  Administered 2014-11-08: 5 mg via ORAL
  Filled 2014-11-07: qty 1

## 2014-11-07 MED ORDER — THYROID 30 MG PO TABS: 15.0000 mg | ORAL_TABLET | Freq: Every day | ORAL | Status: DC

## 2014-11-07 NOTE — H&P (Signed)
[facility] History and Physical  Jo Chung CXK:[id number] DOB: [birth year] DOA: 11/07/2014  Referring physician: er PCP: Ledon Snare, MD   Chief Complaint: weakness  HPI: Jo Chung is a 49 y.o. 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  Last PM she developed an episode of slurred speech, facial droop and b/l leg weakness and numbness (left is always weaker).  In 2015 she had a CVA and has left sided residual deficit, able to walk but wears a brace on her leg.  Does report heart "fluttering".  She has had an increase amount of diarrhea recently due to her colitis.  She has been weaning off endocort by GI.  In the ER, patient was found to have a K of < 2.  She was given 10 meg by IV and I asked for PO replacement as well.    Review of Systems:  All systems removed, negative unless stated above    Past Medical History  Diagnosis Date  . Stroke (HCC)   . Hypertension   . Fibromyalgia   . H/O Hashimoto thyroiditis   . Lymphocytic colitis   . Anxiety   . Vitamin B 12 deficiency   . Insomnia   . Lupus (HCC)   . LGSIL (low grade squamous intraepithelial dysplasia)   . Dermatosis papulosa nigra   . Intertrigo   . Obesity    Past Surgical History  Procedure Laterality Date  . Cholecystectomy    . Ureteral stent placement    . Cesarean section    . Breast reduction surgery    . Breast lumpectomy     Social History:  reports that she has never smoked. She has never used smokeless tobacco. She reports that she does not drink alcohol or use illicit drugs.  Allergies  Allergen Reactions  . Codeine Nausea Only    Family Hx: +HTN  Prior to Admission medications   Medication Sig Start Date End Date Taking? Authorizing Provider  aspirin EC 81 MG tablet Take 81 mg by mouth daily.   Yes Historical Provider, MD  baclofen (LIORESAL) 10 MG tablet Take 10 mg by mouth 4 (four) times daily as needed for  muscle spasms.   Yes Historical Provider, MD  budesonide (ENTOCORT EC) 3 MG 24 hr capsule Take 3 mg by mouth daily.   Yes Historical Provider, MD  dicyclomine (BENTYL) 10 MG capsule Take 10 mg by mouth 4 (four) times daily -  with meals and at bedtime.   Yes Historical Provider, MD  ergocalciferol (VITAMIN D2) 50000 UNITS capsule Take 50,000 Units by mouth once a week. On monday   Yes Historical Provider, MD  lisinopril (PRINIVIL,ZESTRIL) 20 MG tablet Take 20 mg by mouth daily.   Yes Historical Provider, MD  oxyCODONE (OXY IR/ROXICODONE) 5 MG immediate release tablet Take 5 mg by mouth every 4 (four) hours as needed for severe pain.   Yes Historical Provider, MD  sertraline (ZOLOFT) 100 MG tablet Take 100 mg by mouth daily.   Yes Historical Provider, MD  thyroid (ARMOUR) 15 MG tablet Take 15 mg by mouth daily.   Yes Historical Provider, MD  thyroid (ARMOUR) 90 MG tablet Take 90 mg by mouth daily.   Yes Historical Provider, MD  triamcinolone ointment (KENALOG) 0.5 % Apply 1 application topically 2 (two) times daily as needed (for rash).   Yes Historical Provider, MD  zolpidem (AMBIEN) 10 MG tablet Take 10 mg by mouth  at bedtime as needed for sleep.   Yes Historical Provider, MD  Cyanocobalamin (NASCOBAL) 500 MCG/0.1ML SOLN One spray, one nostril, once a week..... MAKE OFFICE VISIT. Patient not taking: Reported on 11/07/2014 09/22/10   Mardella Laymanavid R Patterson, MD   Physical Exam: Filed Vitals:   11/07/14 1425 11/07/14 1507 11/07/14 1600 11/07/14 1700  BP: 149/97 138/85 131/75 133/78  Pulse: 76 80 70 82  Temp: 97.4 F (36.3 C)     TempSrc: Oral     Resp: 16 17 16 25   SpO2: 97% 98% 97% 97%    Wt Readings from Last 3 Encounters:  07/26/09 74.563 kg (164 lb 6.1 oz)    General:  Appears calm and comfortable Eyes: PERRL, normal lids, irises & conjunctiva ENT: grossly normal hearing, lips & tongue Neck: no LAD, masses or thyromegaly Cardiovascular: RRR, no m/r/g. No LE edema. Telemetry: SR, no  arrhythmias  Respiratory: CTA bilaterally, no w/r/r. Normal respiratory effort. Abdomen: soft, ntnd Skin: no rash or induration seen on limited exam Musculoskeletal: grossly normal tone BUE/BLE Psychiatric: grossly normal mood and affect, speech fluent and appropriate Neurologic: old left sided weakness- good proprioception          Labs on Admission:  Basic Metabolic Panel:  Recent Labs Lab 11/07/14 1518 11/07/14 1636  NA 138  --   K <2.0*  --   CL 93*  --   CO2 31  --   GLUCOSE 127*  --   BUN <5*  --   CREATININE 0.61  --   CALCIUM 7.9*  --   MG  --  1.7   Liver Function Tests:  Recent Labs Lab 11/07/14 1518  AST 29  ALT 15  ALKPHOS 38  BILITOT 0.5  PROT 6.5  ALBUMIN 3.4*   No results for input(s): LIPASE, AMYLASE in the last 168 hours. No results for input(s): AMMONIA in the last 168 hours. CBC:  Recent Labs Lab 11/07/14 1518  WBC 7.8  NEUTROABS 5.9  HGB 13.2  HCT 41.1  MCV 81.9  PLT 315   Cardiac Enzymes: No results for input(s): CKTOTAL, CKMB, CKMBINDEX, TROPONINI in the last 168 hours.  BNP (last 3 results) No results for input(s): BNP in the last 8760 hours.  ProBNP (last 3 results) No results for input(s): PROBNP in the last 8760 hours.  CBG: No results for input(s): GLUCAP in the last 168 hours.  Radiological Exams on Admission: Ct Head Wo Contrast  11/07/2014  CLINICAL DATA:  Progressive right-sided weakness. History of stroke. EXAM: CT HEAD WITHOUT CONTRAST TECHNIQUE: Contiguous axial images were obtained from the base of the skull through the vertex without intravenous contrast. COMPARISON:  CT scan dated 07/15/2004 and MRI dated 09/30/2004 FINDINGS: There is no acute intracranial hemorrhage, acute infarction, or intracranial mass lesion. There is an old infarct in the body of the right caudate. There is also an old infarct deep to the right insular cortex. Brain parenchyma is otherwise normal. Osseous structures are normal. IMPRESSION:  No acute intracranial abnormality. Old right cerebral hemisphere infarcts as described. Electronically Signed   By: Francene BoyersJames  Maxwell M.D.   On: 11/07/2014 15:30    EKG: Independently reviewed. NSR  Assessment/Plan Active Problems:   H/O Hashimoto thyroiditis   Hypokalemia   Colitis   Hypokalemia Given 40 meg PO x1 in ER along with 30 meg IV -will replace Mg- 1.7 -will give 40 meq PO q 4 hours x 3 Total of 190 meq total----  Will recheck K at 10  PM Suspect will need more replacement - hypokalemia may be due to excessive diarrhea patient has due to colitis-- has been being weaned off endocort by GI ([facility])  H/o thyroiditis -on armour thyroid -check TSH  H/o CVA -suspect K is contributing to her b/l weakness (left old) -if K does not fix weakness then MRI in AM Tele -patient is not a candidate for TPA due to out of time frame > 24 hours since start of symptoms  Lymphocytic colitis -continue home meds  Code Status: full DVT Prophylaxis: Family Communication: patient and family at bedside Disposition Plan:    Time spent: 75 min  Marlin Canary [facility] Pager [phone]

## 2014-11-07 NOTE — ED Provider Notes (Signed)
CSN: [id number]     Arrival date & time 11/07/14  1414 History   First MD Initiated Contact with Patient 11/07/14 1437     Chief Complaint  Patient presents with  . Cerebrovascular Accident     (Consider location/radiation/quality/duration/timing/severity/associated sxs/prior Treatment) HPI The patient presents to the Emergency Department with her daughter for evaluation of "slurred speech, right sided facial droop, bilateral leg weakness and numbness." Pt's daughter states she first noticed her mother's symptoms late last night. Pt says she went to bed and woke up this morning with right facial swelling, drooling, and slurred speech. She went back to bed and woke up again with weakness in both legs and feelings of unsteadiness. She endorses headache, visual changes on her right eye, and chronic tinnitus. She states she saw a "pulse of light blinking" in her right eye, which last for a few minutes. She states she still feels weakness in bilateral lower extremities (R>L) and reports history of fibromyalgia.  Patient denies chest pain, shortness of breath, nausea, vomiting, blurred vision, back pain, neck pain, fever, dysuria, incontinence, bloody stool, diarrhea, abdominal pain, or syncope  Past Medical History  Diagnosis Date  . Stroke (HCC)   . Hypertension   . Fibromyalgia   . H/O Hashimoto thyroiditis   . Lymphocytic colitis   . Anxiety   . Vitamin B 12 deficiency   . Insomnia   . Lupus (HCC)   . LGSIL (low grade squamous intraepithelial dysplasia)   . Dermatosis papulosa nigra   . Intertrigo   . Obesity    Past Surgical History  Procedure Laterality Date  . Cholecystectomy    . Ureteral stent placement    . Cesarean section    . Breast reduction surgery    . Breast lumpectomy     No family history on file. Social History  Substance Use Topics  . Smoking status: Never Smoker   . Smokeless tobacco: Never Used  . Alcohol Use: No   OB History    No data available      Review of Systems  All other systems negative except as documented in the HPI. All pertinent positives and negatives as reviewed in the HPI.  Allergies  Codeine  Home Medications   Prior to Admission medications   Medication Sig Start Date End Date Taking? Authorizing Provider  aspirin EC 81 MG tablet Take 81 mg by mouth daily.   Yes Historical Provider, MD  baclofen (LIORESAL) 10 MG tablet Take 10 mg by mouth 4 (four) times daily as needed for muscle spasms.   Yes Historical Provider, MD  budesonide (ENTOCORT EC) 3 MG 24 hr capsule Take 3 mg by mouth daily.   Yes Historical Provider, MD  dicyclomine (BENTYL) 10 MG capsule Take 10 mg by mouth 4 (four) times daily -  with meals and at bedtime.   Yes Historical Provider, MD  ergocalciferol (VITAMIN D2) 50000 UNITS capsule Take 50,000 Units by mouth once a week. On monday   Yes Historical Provider, MD  lisinopril (PRINIVIL,ZESTRIL) 20 MG tablet Take 20 mg by mouth daily.   Yes Historical Provider, MD  oxyCODONE (OXY IR/ROXICODONE) 5 MG immediate release tablet Take 5 mg by mouth every 4 (four) hours as needed for severe pain.   Yes Historical Provider, MD  sertraline (ZOLOFT) 100 MG tablet Take 100 mg by mouth daily.   Yes Historical Provider, MD  thyroid (ARMOUR) 15 MG tablet Take 15 mg by mouth daily.   Yes Historical Provider, MD  thyroid (ARMOUR) 90 MG tablet Take 90 mg by mouth daily.   Yes Historical Provider, MD  triamcinolone ointment (KENALOG) 0.5 % Apply 1 application topically 2 (two) times daily as needed (for rash).   Yes Historical Provider, MD  zolpidem (AMBIEN) 10 MG tablet Take 10 mg by mouth at bedtime as needed for sleep.   Yes Historical Provider, MD  Cyanocobalamin (NASCOBAL) 500 MCG/0.1ML SOLN One spray, one nostril, once a week..... MAKE OFFICE VISIT. Patient not taking: Reported on 11/07/2014 09/22/10   Mardella Laymanavid R Patterson, MD   BP 149/97 mmHg  Pulse 76  Temp(Src) 97.4 F (36.3 C) (Oral)  Resp 16  SpO2  97% Physical Exam  Constitutional: She is oriented to person, place, and time. She appears well-developed and well-nourished. No distress.  HENT:  Head: Normocephalic and atraumatic.  Mouth/Throat: Oropharynx is clear and moist.  Eyes: Pupils are equal, round, and reactive to light.  Neck: Normal range of motion. Neck supple.  Cardiovascular: Normal rate, regular rhythm and normal heart sounds.  Exam reveals no gallop and no friction rub.   No murmur heard. Pulmonary/Chest: Effort normal and breath sounds normal. No respiratory distress. She has no wheezes. She has no rales.  Abdominal: Soft. Bowel sounds are normal. She exhibits no distension. There is no tenderness.  Musculoskeletal: She exhibits no edema.  Neurological: She is alert and oriented to person, place, and time. No cranial nerve deficit. She exhibits normal muscle tone. Coordination normal.  Skin: Skin is warm and dry. No rash noted. No erythema. No pallor.  Psychiatric: She has a normal mood and affect. Her behavior is normal. Judgment and thought content normal.  Nursing note and vitals reviewed.   ED Course  Procedures (including critical care time) Labs Review Labs Reviewed  PROTIME-INR  APTT  CBC  DIFFERENTIAL  COMPREHENSIVE METABOLIC PANEL  I-STAT TROPOININ, ED    Imaging Review No results found. I have personally reviewed and evaluated these images and lab results as part of my medical decision-making.   EKG Interpretation None      Patient will be admitted to the hospital for further evaluation and completion of her potassium spoke with the [facility] who agrees to admit the patient  Charlestine NightChristopher Wrenn Willcox, PA-C 11/07/14 1913  Raeford RazorStephen Kohut, MD 11/09/14 [phone]

## 2014-11-07 NOTE — ED Notes (Signed)
Potassium received from lab. EDP Kohut, Lawyer aware.

## 2014-11-07 NOTE — ED Notes (Signed)
Pt reports to the ED for eval of an episode of slurred speech, right sided facial droop, and bilateral leg weakness and numbness. Hx of previous CVA in 04/2013. She has some left sided arm drift which is residual from an old CVA. Some left sided facial droop at baseline from previous CVA as well. Reports some heart "fluttering" but denies any CP, HAs, or SOB. Pt A&Ox4, resp e/u, and skin warm and dry.

## 2014-11-07 NOTE — ED Notes (Signed)
Lab aware of add on Mag.  

## 2014-11-07 NOTE — Progress Notes (Signed)
Pt. With critical K+ value of 2.3. Text page sent to PA-C, [place]. Jenasis Straley, Cheryll DessertKaren Cherrell

## 2014-11-08 ENCOUNTER — Observation Stay (HOSPITAL_COMMUNITY): Payer: PRIVATE HEALTH INSURANCE

## 2014-11-08 DIAGNOSIS — R531 Weakness: Secondary | ICD-10-CM

## 2014-11-08 DIAGNOSIS — Z8639 Personal history of other endocrine, nutritional and metabolic disease: Secondary | ICD-10-CM | POA: Diagnosis not present

## 2014-11-08 DIAGNOSIS — K529 Noninfective gastroenteritis and colitis, unspecified: Secondary | ICD-10-CM | POA: Diagnosis not present

## 2014-11-08 DIAGNOSIS — E876 Hypokalemia: Secondary | ICD-10-CM | POA: Diagnosis not present

## 2014-11-08 LAB — BASIC METABOLIC PANEL
ANION GAP: 10 (ref 5–15)
BUN: <5 mg/dL — ABNORMAL LOW (ref 6–20)
CALCIUM: 7.8 mg/dL — AB (ref 8.9–10.3)
CO2: 31 mmol/L (ref 22–32)
Chloride: 99 mmol/L — ABNORMAL LOW (ref 101–111)
Creatinine, Ser: 0.5 mg/dL (ref 0.44–1.00)
GFR calc non Af Amer: >60 mL/min (ref >60)
GLUCOSE: 87 mg/dL (ref 65–99)
POTASSIUM: 2.5 mmol/L — AB (ref 3.5–5.1)
SODIUM: 140 mmol/L (ref 135–145)

## 2014-11-08 LAB — CBC
HEMATOCRIT: 39.1 % (ref 36.0–46.0)
HEMOGLOBIN: 12.5 g/dL (ref 12.0–15.0)
MCH: 26.1 pg (ref 26.0–34.0)
MCHC: 32 g/dL (ref 30.0–36.0)
MCV: 81.6 fL (ref 78.0–100.0)
Platelets: 299 10*3/uL (ref 150–400)
RBC: 4.79 MIL/uL (ref 3.87–5.11)
RDW: 14.4 % (ref 11.5–15.5)
WBC: 9.7 10*3/uL (ref 4.0–10.5)

## 2014-11-08 LAB — POTASSIUM: POTASSIUM: 3.4 mmol/L — AB (ref 3.5–5.1)

## 2014-11-08 MED ORDER — POTASSIUM CHLORIDE CRYS ER 20 MEQ PO TBCR
40.0000 meq | EXTENDED_RELEASE_TABLET | ORAL | Status: AC
  Administered 2014-11-08 (×3): 40 meq via ORAL
  Filled 2014-11-08 (×2): qty 2

## 2014-11-08 NOTE — Progress Notes (Signed)
Pt. With critical K+ value of 2.5. Text page sent to PA-C, [place]. Mirel Hundal, Cheryll DessertKaren Cherrell

## 2014-11-08 NOTE — Discharge Summary (Signed)
Physician Discharge Summary  TABATHA RAZZANO ZOX:[id number] DOB: [birth year] DOA: 11/07/2014  PCP: Ledon Snare, MD  Admit date: 11/07/2014 Discharge date: 11/08/2014  Time spent: 35 minutes  Recommendations for Outpatient Follow-up:  1. BMp 1 week- may need to start KDUR if diarrhea not controlled  Discharge Diagnoses:  Active Problems:   H/O Hashimoto thyroiditis   Hypokalemia   Colitis   Discharge Condition: improved  Diet recommendation: regular  Filed Weights   11/07/14 1818 11/08/14 0550  Weight: 70.988 kg (156 lb 8 oz) 69.718 kg (153 lb 11.2 oz)    History of present illness:  Jo Chung is a 49 y.o. 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 Last PM she developed an episode of slurred speech, facial droop and b/l leg weakness and numbness (left is always weaker). In 2015 she had a CVA and has left sided residual deficit, able to walk but wears a brace on her leg. Does report heart "fluttering". She has had an increase amount of diarrhea recently due to her colitis. She has been weaning off endocort by GI.  In the ER, patient was found to have a K of < 2. She was given 10 meg by IV and I asked for PO replacement as well.  Hospital Course:  Hypokalemia Given 310 meq total for replacement -weakness improved - hypokalemia may be due to excessive diarrhea patient has due to colitis-- has been being weaned off endocort by GI ([facility])  H/o thyroiditis -on armour thyroid -TSH ok  H/o CVA -suspect K is contributing to her b/l weakness (left old) -MRI negative for acute CVA  Lymphocytic colitis -continue home meds- needs to take anitdiarrhea meds to lessen diarrhea but does not due to cramping   Procedures:    Consultations:    Discharge Exam: Filed Vitals:   11/08/14 1438  BP: 108/66  Pulse: 81  Temp: 98 F (36.7 C)  Resp: 18    General: awake, weakness improved   Discharge  Instructions   Discharge Instructions    Diet general    Complete by:  As directed      Discharge instructions    Complete by:  As directed   BMp 1 week re K Would see GI ASAP for diarrhea- can try imodium or Kaopectate     Increase activity slowly    Complete by:  As directed           Current Discharge Medication List    CONTINUE these medications which have NOT CHANGED   Details  aspirin EC 81 MG tablet Take 81 mg by mouth daily.    baclofen (LIORESAL) 10 MG tablet Take 10 mg by mouth 4 (four) times daily as needed for muscle spasms.    budesonide (ENTOCORT EC) 3 MG 24 hr capsule Take 3 mg by mouth daily.    dicyclomine (BENTYL) 10 MG capsule Take 10 mg by mouth 4 (four) times daily -  with meals and at bedtime.    ergocalciferol (VITAMIN D2) 50000 UNITS capsule Take 50,000 Units by mouth once a week. On monday    lisinopril (PRINIVIL,ZESTRIL) 20 MG tablet Take 20 mg by mouth daily.    oxyCODONE (OXY IR/ROXICODONE) 5 MG immediate release tablet Take 5 mg by mouth every 4 (four) hours as needed for severe pain.    sertraline (ZOLOFT) 100 MG tablet Take 100 mg by mouth daily.    !! thyroid (ARMOUR) 15 MG tablet  Take 15 mg by mouth daily.    !! thyroid (ARMOUR) 90 MG tablet Take 90 mg by mouth daily.    triamcinolone ointment (KENALOG) 0.5 % Apply 1 application topically 2 (two) times daily as needed (for rash).    zolpidem (AMBIEN) 10 MG tablet Take 10 mg by mouth at bedtime as needed for sleep.     !! - Potential duplicate medications found. Please discuss with provider.    STOP taking these medications     Cyanocobalamin (NASCOBAL) 500 MCG/0.1ML SOLN        Allergies  Allergen Reactions  . Codeine Nausea Only   Follow-up Information    Follow up with Ledon SnareFARLAND,SANDRA, MD In 1 week.   Specialty:  Family Medicine   Why:  BMP      Follow up with Redgie GrayerFINA,MICHAEL, MD.   Specialty:  Internal Medicine   Why:  ASAP   Contact information:   [address] [place] [id number] [phone]        The results of significant diagnostics from this hospitalization (including imaging, microbiology, ancillary and laboratory) are listed below for reference.    Significant Diagnostic Studies: Ct Head Wo Contrast  11/07/2014  CLINICAL DATA:  Progressive right-sided weakness. History of stroke. EXAM: CT HEAD WITHOUT CONTRAST TECHNIQUE: Contiguous axial images were obtained from the base of the skull through the vertex without intravenous contrast. COMPARISON:  CT scan dated 07/15/2004 and MRI dated 09/30/2004 FINDINGS: There is no acute intracranial hemorrhage, acute infarction, or intracranial mass lesion. There is an old infarct in the body of the right caudate. There is also an old infarct deep to the right insular cortex. Brain parenchyma is otherwise normal. Osseous structures are normal. IMPRESSION: No acute intracranial abnormality. Old right cerebral hemisphere infarcts as described. Electronically Signed   By: Francene BoyersJames  Maxwell M.D.   On: 11/07/2014 15:30   Mr Brain Wo Contrast  11/08/2014  CLINICAL DATA:  49 year old female With acute slurred speech, facial droop, bilateral extremity weakness and numbness. Reports previous stroke in 2015. Initial encounter. EXAM: MRI HEAD WITHOUT CONTRAST TECHNIQUE: Multiplanar, multiecho pulse sequences of the brain and surrounding structures were obtained without intravenous contrast. COMPARISON:  Head CT without contrast 11/07/2014. Cervical spine MRI 12/04/2004 FINDINGS: No restricted diffusion to suggest acute infarction. No midline shift, mass effect, evidence of mass lesion, ventriculomegaly, extra-axial collection or acute intracranial hemorrhage. Cervicomedullary junction and pituitary are within normal limits. Major intracranial vascular flow voids are within normal limits, dominant appearing distal left vertebral artery. Chronic right coronal radiata infarct with encephalomalacia and mild gliosis.  Associated mild Wallerian degeneration most apparent at the right midbrain and pons. Elsewhere normal for age gray and white matter signal. Visible internal auditory structures appear normal. Mastoids and paranasal sinuses are clear. Negative orbit and scalp soft tissues. Negative visualized cervical spine. Normal bone marrow signal. IMPRESSION: 1.  No acute intracranial abnormality. 2. Chronic right MCA territory white matter infarct with Wallerian degeneration. Electronically Signed   By: Odessa FlemingH  Hall M.D.   On: 11/08/2014 16:22    Microbiology: No results found for this or any previous visit (from the past 240 hour(s)).   Labs: Basic Metabolic Panel:  Recent Labs Lab 11/07/14 1518 11/07/14 1636 11/07/14 2050 11/08/14 0255 11/08/14 1142  NA 138  --   --  140  --   K <2.0*  --  2.3* 2.5* 3.4*  CL 93*  --   --  99*  --   CO2 31  --   --  31  --   GLUCOSE 127*  --   --  87  --   BUN <5*  --   --  <5*  --   CREATININE 0.61  --   --  0.50  --   CALCIUM 7.9*  --   --  7.8*  --   MG  --  1.7  --   --   --    Liver Function Tests:  Recent Labs Lab 11/07/14 1518  AST 29  ALT 15  ALKPHOS 38  BILITOT 0.5  PROT 6.5  ALBUMIN 3.4*   No results for input(s): LIPASE, AMYLASE in the last 168 hours. No results for input(s): AMMONIA in the last 168 hours. CBC:  Recent Labs Lab 11/07/14 1518 11/08/14 0255  WBC 7.8 9.7  NEUTROABS 5.9  --   HGB 13.2 12.5  HCT 41.1 39.1  MCV 81.9 81.6  PLT 315 299   Cardiac Enzymes: No results for input(s): CKTOTAL, CKMB, CKMBINDEX, TROPONINI in the last 168 hours. BNP: BNP (last 3 results) No results for input(s): BNP in the last 8760 hours.  ProBNP (last 3 results) No results for input(s): PROBNP in the last 8760 hours.  CBG: No results for input(s): GLUCAP in the last 168 hours.     SignedMarlin Canary  [facility] 11/08/2014, 4:35 PM

## 2015-02-17 ENCOUNTER — Encounter (HOSPITAL_COMMUNITY): Payer: Self-pay | Admitting: Radiology

## 2015-02-17 ENCOUNTER — Emergency Department (HOSPITAL_COMMUNITY)
Admission: EM | Admit: 2015-02-17 | Discharge: 2015-02-17 | Disposition: A | Discharge status: Home / Self Care | Payer: PRIVATE HEALTH INSURANCE | Attending: Emergency Medicine | Admitting: Emergency Medicine

## 2015-02-17 ENCOUNTER — Emergency Department (HOSPITAL_COMMUNITY): Payer: PRIVATE HEALTH INSURANCE

## 2015-02-17 DIAGNOSIS — E538 Deficiency of other specified B group vitamins: Secondary | ICD-10-CM | POA: Insufficient documentation

## 2015-02-17 DIAGNOSIS — M542 Cervicalgia: Secondary | ICD-10-CM

## 2015-02-17 DIAGNOSIS — X58XXXA Exposure to other specified factors, initial encounter: Secondary | ICD-10-CM | POA: Diagnosis not present

## 2015-02-17 DIAGNOSIS — E063 Autoimmune thyroiditis: Secondary | ICD-10-CM | POA: Diagnosis not present

## 2015-02-17 DIAGNOSIS — Y9289 Other specified places as the place of occurrence of the external cause: Secondary | ICD-10-CM | POA: Insufficient documentation

## 2015-02-17 DIAGNOSIS — Z79899 Other long term (current) drug therapy: Secondary | ICD-10-CM | POA: Diagnosis not present

## 2015-02-17 DIAGNOSIS — G4700 Insomnia, unspecified: Secondary | ICD-10-CM | POA: Diagnosis not present

## 2015-02-17 DIAGNOSIS — Y9389 Activity, other specified: Secondary | ICD-10-CM | POA: Insufficient documentation

## 2015-02-17 DIAGNOSIS — Z8719 Personal history of other diseases of the digestive system: Secondary | ICD-10-CM | POA: Diagnosis not present

## 2015-02-17 DIAGNOSIS — E669 Obesity, unspecified: Secondary | ICD-10-CM | POA: Diagnosis not present

## 2015-02-17 DIAGNOSIS — Z872 Personal history of diseases of the skin and subcutaneous tissue: Secondary | ICD-10-CM | POA: Insufficient documentation

## 2015-02-17 DIAGNOSIS — I10 Essential (primary) hypertension: Secondary | ICD-10-CM | POA: Insufficient documentation

## 2015-02-17 DIAGNOSIS — S161XXA Strain of muscle, fascia and tendon at neck level, initial encounter: Secondary | ICD-10-CM

## 2015-02-17 DIAGNOSIS — I69898 Other sequelae of other cerebrovascular disease: Secondary | ICD-10-CM | POA: Diagnosis not present

## 2015-02-17 DIAGNOSIS — M6281 Muscle weakness (generalized): Secondary | ICD-10-CM | POA: Diagnosis not present

## 2015-02-17 DIAGNOSIS — Y998 Other external cause status: Secondary | ICD-10-CM | POA: Diagnosis not present

## 2015-02-17 DIAGNOSIS — Z7982 Long term (current) use of aspirin: Secondary | ICD-10-CM | POA: Insufficient documentation

## 2015-02-17 DIAGNOSIS — Z7951 Long term (current) use of inhaled steroids: Secondary | ICD-10-CM | POA: Insufficient documentation

## 2015-02-17 DIAGNOSIS — F419 Anxiety disorder, unspecified: Secondary | ICD-10-CM | POA: Insufficient documentation

## 2015-02-17 DIAGNOSIS — Z7952 Long term (current) use of systemic steroids: Secondary | ICD-10-CM | POA: Diagnosis not present

## 2015-02-17 DIAGNOSIS — M797 Fibromyalgia: Secondary | ICD-10-CM | POA: Insufficient documentation

## 2015-02-17 MED ORDER — DIAZEPAM 5 MG PO TABS
5.0000 mg | ORAL_TABLET | Freq: Once | ORAL | Status: AC
  Administered 2015-02-17: 5 mg via ORAL
  Filled 2015-02-17: qty 1

## 2015-02-17 MED ORDER — OXYCODONE-ACETAMINOPHEN 5-325 MG PO TABS
1.0000 | ORAL_TABLET | Freq: Once | ORAL | Status: DC
  Filled 2015-02-17: qty 1

## 2015-02-17 MED ORDER — FENTANYL CITRATE (PF) 100 MCG/2ML IJ SOLN
100.0000 ug | Freq: Once | INTRAMUSCULAR | Status: AC
  Administered 2015-02-17: 100 ug via INTRAVENOUS
  Filled 2015-02-17: qty 2

## 2015-02-17 MED ORDER — DIAZEPAM 5 MG PO TABS: 5.0000 mg | ORAL_TABLET | Freq: Three times a day (TID) | ORAL | Status: AC | PRN

## 2015-02-17 MED ORDER — PREDNISONE 50 MG PO TABS: 50.0000 mg | ORAL_TABLET | Freq: Every day | ORAL | Status: AC

## 2015-02-17 MED ORDER — OXYCODONE-ACETAMINOPHEN 5-325 MG PO TABS: 1.0000 | ORAL_TABLET | Freq: Four times a day (QID) | ORAL | Status: AC | PRN

## 2015-02-17 MED ORDER — KETOROLAC TROMETHAMINE 60 MG/2ML IM SOLN
60.0000 mg | Freq: Once | INTRAMUSCULAR | Status: AC
  Administered 2015-02-17: 60 mg via INTRAMUSCULAR
  Filled 2015-02-17: qty 2

## 2015-02-17 NOTE — Discharge Instructions (Signed)
Return here as needed.  Follow-up with your primary care doctor.  Use ice and heat on your neck

## 2015-02-17 NOTE — ED Notes (Signed)
Patient C/O neck pain for 1 week.  States that it got very bad about 1.5 hours ago. Pain is in her left trapezius and rates her pain as a 8/10.  Patient with a history of hypertension and has not taken her medications for one month.  Also has a past history of a CVA with residual left weakness.  She has foot drop on the left and wears a brace

## 2015-02-17 NOTE — ED Provider Notes (Signed)
CSN: [id number]     Arrival date & time 02/17/15  1421 History   First MD Initiated Contact with Patient 02/17/15 1426     Chief Complaint  Patient presents with  . Neck Pain    HPI  Patient presented to the Emergency Department via ambulance complaining of worsening neck pain especially over the past 1.5 hours. 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  Neck pain began Saturday and was preceded by a bitemporal throbbing headache that lasted 5 hours with intermittent episodes of emesis. She describes the pain as a severe tightening sensation located on her left sided upper trapezius. The pain worsens with movement, especially looking to her right. The pain is relieved by sleeping on her L side. She admits to associated numbness and tingling in her LUE, as well as numbness in her LLE. The pain is tender to palpation. The pain occasionally radiates up her neck and into her back. She denies dizziness or lightheadedness, SOB, palpitations or chest pain. She has not had any episodes of headache, nausea, or vomiting.   Past Medical History  Diagnosis Date  . Stroke (HCC)   . Hypertension   . Fibromyalgia   . H/O Hashimoto thyroiditis   . Lymphocytic colitis   . Anxiety   . Vitamin B 12 deficiency   . Insomnia   . Lupus (HCC)   . LGSIL (low grade squamous intraepithelial dysplasia)   . Dermatosis papulosa nigra   . Intertrigo   . Obesity    Past Surgical History  Procedure Laterality Date  . Cholecystectomy    . Ureteral stent placement    . Cesarean section    . Breast reduction surgery    . Breast lumpectomy     No family history on file. Social History  Substance Use Topics  . Smoking status: Never Smoker   . Smokeless tobacco: Never Used  . Alcohol Use: No   OB History    No data available     Review of Systems All other systems negative except as documented in the HPI. All pertinent positives and  negatives as reviewed in the HPI.    Allergies  Ibuprofen and Codeine  Home Medications   Prior to Admission medications   Medication Sig Start Date End Date Taking? Authorizing Provider  budesonide (ENTOCORT EC) 3 MG 24 hr capsule Take 3 mg by mouth daily.   Yes Historical Provider, MD  cyanocobalamin (,VITAMIN B-12,) 1000 MCG/ML injection Inject 1 mL into the muscle once a week. 11/27/13  Yes Historical Provider, MD  dicyclomine (BENTYL) 10 MG capsule Take 10 mg by mouth 4 (four) times daily -  with meals and at bedtime.   Yes Historical Provider, MD  ergocalciferol (VITAMIN D2) 50000 UNITS capsule Take 50,000 Units by mouth once a week. On monday   Yes Historical Provider, MD  LORazepam (ATIVAN) 0.5 MG tablet Take 1 tablet by mouth 2 (two) times daily as needed. anxiety 12/02/14  Yes Historical Provider, MD  thyroid (ARMOUR) 15 MG tablet Take 15 mg by mouth daily.   Yes Historical Provider, MD  thyroid (ARMOUR) 90 MG tablet Take 90 mg by mouth daily.   Yes Historical Provider, MD  triamcinolone ointment (KENALOG) 0.5 % Apply 1 application topically 2 (two) times daily as needed (for rash).   Yes Historical Provider, MD  Vitamin D, Ergocalciferol, (DRISDOL) 50000 units CAPS capsule Take 1 capsule by mouth once a week. 04/28/14  Yes Historical Provider, MD  zolpidem (AMBIEN) 10 MG tablet Take 10 mg by mouth at bedtime as needed for sleep.   Yes Historical Provider, MD  aspirin EC 81 MG tablet Take 81 mg by mouth daily. Reported on 02/17/2015    Historical Provider, MD  baclofen (LIORESAL) 10 MG tablet Take 10 mg by mouth 4 (four) times daily as needed for muscle spasms.    Historical Provider, MD  lisinopril (PRINIVIL,ZESTRIL) 20 MG tablet Take 20 mg by mouth daily. Reported on 02/17/2015    Historical Provider, MD  oxyCODONE (OXY IR/ROXICODONE) 5 MG immediate release tablet Take 5 mg by mouth every 4 (four) hours as needed for severe pain.    Historical Provider, MD  sertraline (ZOLOFT) 100 MG  tablet Take 100 mg by mouth daily.    Historical Provider, MD   BP 149/80 mmHg  Pulse 80  Resp 15  SpO2 100% Physical Exam  Constitutional: She is oriented to person, place, and time. She appears well-developed and well-nourished. She appears distressed (moderately due to pain).  HENT:  Head: Normocephalic and atraumatic.  Mouth/Throat: Oropharynx is clear and moist.  Eyes: Pupils are equal, round, and reactive to light.  Neck: Normal range of motion. Neck supple.  ROM limited due to pain  Cardiovascular: Normal rate, regular rhythm and normal heart sounds.  Exam reveals no gallop and no friction rub.   No murmur heard. Pulmonary/Chest: Effort normal and breath sounds normal. No respiratory distress.  Abdominal: Soft. Bowel sounds are normal. She exhibits no distension and no mass. There is no tenderness. There is no rebound and no guarding.  Musculoskeletal: She exhibits no edema or tenderness.  Patient has left sided residual weakness from prior CVA.   Neurological: She is alert and oriented to person, place, and time. She has normal reflexes. She exhibits normal muscle tone. Coordination normal.  Skin: Skin is warm and dry.  Psychiatric: She has a normal mood and affect. Her behavior is normal.  Nursing note and vitals reviewed.   ED Course  Procedures (including critical care time) Labs Review  Labs Reviewed - No data to display  Imaging Review Ct Cervical Spine Wo Contrast  02/17/2015  CLINICAL DATA:  50 year old presenting with 1 week history of left-sided neck pain and left neck muscle spasms that began earlier today. No known injuries. Personal history of stroke approximately 2 years ago affecting the left side of the body. EXAM: CT CERVICAL SPINE WITHOUT CONTRAST TECHNIQUE: Multidetector CT imaging of the cervical spine was performed without intravenous contrast. Multiplanar CT image reconstructions were also generated. COMPARISON:  None. FINDINGS: No fractures identified  involving the cervical spine. Sagittal reconstructed images demonstrate anatomic alignment. Facet joints intact throughout without significant degenerative change. No spinal stenosis. Mild central disc protrusion at C5-6. Coronal reformatted images demonstrate an intact craniocervical junction, intact C1-C2 articulation, intact dens, and intact lateral masses throughout. No evidence of neural foraminal stenosis at any level. IMPRESSION: 1. Mild central disc protrusion at C5-6. 2. Otherwise normal examination. Electronically Signed   By: Hulan Saas M.D.   On: 02/17/2015 18:24   I have personally reviewed and evaluated these images and lab results as part of my medical decision-making.   EKG Interpretation None     1756 Patient still endorses having pain, but states that it is improved. She is able to move her neck more. States the Tordol and Valium have helped, but that it is still continuing to tighten on and off.  Patient be treated for cervical strain and muscular pain.  Told to return here as needed.  Patient is advised to follow-up with her primary care doctor.  Also have her follow-up with neurosurgery as needed.   She may need an MRI at some point to further delineate this issue, but at this point it is an acute process that we will attempt treatment with medications.  Told to use ice and heat on her neck.  Patient agrees the plan and all questions were answered.  Patient has no neurological deficits noted on examination  Charlestine Night, PA-C 02/17/15 1925  Lavera Guise, MD 02/18/15 [phone]

## 2015-02-17 NOTE — ED Notes (Signed)
Fentanyl given IM per [place], [place]

## 2015-02-17 NOTE — ED Notes (Signed)
PT made aware she cannot drive or operate machinery for the rest of the night or while using oxycodone or valium for pain

## 2015-09-07 ENCOUNTER — Ambulatory Visit: Payer: BLUE CROSS/BLUE SHIELD | Attending: Psychiatry | Admitting: Rehabilitative and Restorative Service Providers"

## 2015-09-07 DIAGNOSIS — R29818 Other symptoms and signs involving the nervous system: Secondary | ICD-10-CM | POA: Diagnosis present

## 2015-09-07 DIAGNOSIS — R2689 Other abnormalities of gait and mobility: Secondary | ICD-10-CM | POA: Diagnosis present

## 2015-09-07 DIAGNOSIS — M6281 Muscle weakness (generalized): Secondary | ICD-10-CM | POA: Diagnosis not present

## 2015-09-07 NOTE — Patient Instructions (Signed)
KNEE: Flexion - Prone    Bend knee. Raise heel toward buttocks. Do not raise hips. _10__ reps per set, _2__ sets per day.    Copyright  VHI. All rights reserved.   Hip Extension: Prone    Tighten gluteal muscle. Lift one leg _10__ times. Restabilize pelvis. Repeat with other leg. Keep pelvis still. Be sure pelvis does not rotate and back does not arch. Do _2__ sets, __2_ times per day.  http://ss.exer.us/65   Copyright  VHI. All rights reserved.   Balance: One-Leg    Stand on left leg. Use hand supports. Let go with left hand, then right hand. Support as little as possible but as much as needed. Stand _10__ seconds. Repeat, standing on right leg.  Repeat 3 times on the left leg x 2 times/day.  Copyright  VHI. All rights reserved.

## 2015-09-08 NOTE — Therapy (Signed)
[facility] [address] [place], [place], [address] Phone: [phone]   Fax:  [phone]  Physical Therapy Evaluation  Patient Details  Name: Jo Chung MRN: [id number] Date of Birth: 01/30/1965 Referring Provider: Bronson Ingavid Lacey, MD  Encounter Date: 09/07/2015      PT End of Session - 09/08/15 1507    Visit Number 1   Number of Visits 16   Date for PT Re-Evaluation 10/08/15   PT Start Time 1022   PT Stop Time 1102   PT Time Calculation (min) 40 min   Equipment Utilized During Treatment Gait belt   Activity Tolerance Patient tolerated treatment well   Behavior [facility] for tasks assessed/performed      Past Medical History:  Diagnosis Date  . Anxiety   . Dermatosis papulosa nigra   . Fibromyalgia   . H/O Hashimoto thyroiditis   . Hypertension   . Insomnia   . Intertrigo   . LGSIL (low grade squamous intraepithelial dysplasia)   . Lupus (HCC)   . Lymphocytic colitis   . Obesity   . Stroke (HCC)   . Vitamin B 12 deficiency     Past Surgical History:  Procedure Laterality Date  . BREAST LUMPECTOMY    . BREAST REDUCTION SURGERY    . CESAREAN SECTION    . CHOLECYSTECTOMY    . URETERAL STENT PLACEMENT      There were no vitals filed for this visit.       Subjective Assessment - 09/07/15 1957    Subjective The patient reports that when she stands up, her knee locks and pitches her forward.  She also notes that her knee is stiff during gait and she is having difficulty walking longer distances.  She reports walking from her car into a store is fatiguing and pain (from fibromyalgia is worse).     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    Patient Stated Goals Improve enudrance, reduce limp on the left side.     Currently in Pain? Yes   Pain Score 3    Pain Location --  "all over from neck down" from fibromyalgia   Pain Onset More than a month ago   Pain Frequency  Intermittent   Aggravating Factors  varies in intensity; worse when fatigued with walking   Pain Relieving Factors none   Effect of Pain on Daily Activities PT to monitor patient, but no pain goal to follow due to chronic nature of pain and nature of current referral            [facility] Assessment - 09/07/15 1958      Assessment   Medical Diagnosis CVA with L hemiparesis   Referring Provider Bronson Ingavid Lacey, MD   Prior Therapy known to our clinic from 2 years ago per patient     Precautions   Precautions Fall   Required Braces or Orthoses --  L AFO     Restrictions   Weight Bearing Restrictions No     Balance Screen   Has the patient fallen in the past 6 months No   Has the patient had a decrease in activity level because of a fear of falling?  No   Is the patient reluctant to leave their home because of a fear of falling?  No     Home Environment   Living Environment Private residence   Living Arrangements Children   Type of Home House  Home Access Level entry   Additional Comments no equipment needed; Uses AFO obtained by [employer] earlier this year.      Prior Function   Level of Independence Independent with household mobility with device;Independent with basic ADLs     Posture/Postural Control   Posture/Postural Control Postural limitations   Postural Limitations Rounded Shoulders;Forward head     ROM / Strength   AROM / PROM / Strength AROM;Strength     AROM   Overall AROM  Deficits   Overall AROM Comments L shoulder mild decrease in motion.       Strength   Overall Strength Deficits   Overall Strength Comments R LE 4/5 throughout for hip flexion, knee flexion/extension and ankle dorsiflexion; L LE 3/5 for hip flexion, knee flexion/extension.  She has trace ankle dorsiflexion.      Bed Mobility   Bed Mobility --  independent with bed mobility.     Transfers   Transfers Sit to Stand;Stand to Sit   Sit to Stand 7: Independent     Ambulation/Gait    Ambulation/Gait Yes   Ambulation/Gait Assistance 6: Modified independent (Device/Increase time)   Ambulation Distance (Feet) 200 Feet   Assistive device None   Gait Pattern Decreased stride length;Decreased stance time - left;Decreased step length - right;Decreased hip/knee flexion - left;Decreased dorsiflexion - left;Decreased weight shift to left;Left circumduction;Poor foot clearance - left   Gait velocity 1.93 ft/sec     Standardized Balance Assessment   Standardized Balance Assessment Berg Balance Test     Berg Balance Test   Sit to Stand Able to stand without using hands and stabilize independently   Standing Unsupported Able to stand safely 2 minutes   Sitting with Back Unsupported but Feet Supported on Floor or Stool Able to sit safely and securely 2 minutes   Stand to Sit Sits safely with minimal use of hands   Transfers Able to transfer safely, minor use of hands   Standing Unsupported with Eyes Closed Able to stand 10 seconds safely   Standing Ubsupported with Feet Together Able to place feet together independently and stand 1 minute safely   From Standing, Reach Forward with Outstretched Arm Can reach confidently >25 cm (10")   From Standing Position, Pick up Object from Floor Able to pick up shoe safely and easily   From Standing Position, Turn to Look Behind Over each Shoulder Looks behind from both sides and weight shifts well   Turn 360 Degrees Able to turn 360 degrees safely but slowly   Standing Unsupported, Alternately Place Feet on Step/Stool Able to complete 4 steps without aid or supervision   Standing Unsupported, One Foot in Front Able to take small step independently and hold 30 seconds   Standing on One Leg Able to lift leg independently and hold > 10 seconds  unable to do any single leg stance on L LE   Total Score 50   Berg comment: 50/56                     THERAPEUTIC EXERCISE: Hip extension, knee flexion, single leg *see patient  printout.        PT Education - 09/08/15 1507    Education provided Yes   Education Details knee flexion standing, hip extension prone, single leg stance   Person(s) Educated Patient   Methods Explanation;Demonstration;Handout   Comprehension Verbalized understanding;Returned demonstration          PT Short Term Goals - 09/08/15 1946  PT SHORT TERM GOAL #1   Title STGs=LTGs           PT Long Term Goals - 09/08/15 1946      PT LONG TERM GOAL #1   Title The patient will return demo HEP for LE strengthening, general mobility and endurance training.   Baseline Target date 10/08/2015   Time 4   Period Weeks     PT LONG TERM GOAL #2   Title The patient will improve gait speed from 1.93 ft/sec to > or equal to 2.3 ft/sec to demo improving functional mobility.   Baseline Target date 10/08/2015   Time 4   Period Weeks     PT LONG TERM GOAL #3   Title The patient will perform single leg stance on L LE x 3 seconds to demo improved stance control for gait activities.   Baseline Target date 10/08/2015   Time 4   Period Weeks     PT LONG TERM GOAL #4   Title The patient will improve L LE strength in hip flexion and knee flexion to 4/5 to demo improved strength for gait activities.   Baseline Target date 10/08/2015   Time 4   Period Weeks     PT LONG TERM GOAL #5   Title The patient will ambulate for 10 minutes nonstop to demo improving functional endurance for community mobility.   Baseline Target date 10/08/2015   Time 4   Period Weeks               Plan - 09/08/15 1950    Clinical Impression Statement The patient is a 50 year old female known to our clinic from prior PT s/p CVA.  She presents today reporting worsening weakness and fatigue during gait activities.  She also notes greater imbalance and difficulty clearing L LE during gait activites.  PT to address and educate in HEP to optimize current functional status.   Rehab Potential Good   PT Frequency 2x  / week   PT Duration 4 weeks   PT Treatment/Interventions ADLs/Self Care Home Management;Therapeutic activities;Therapeutic exercise;Gait training;Functional mobility training;Patient/family education;Orthotic Fit/Training;Neuromuscular re-education   PT Next Visit Plan Review initial HEP: add further L LE strengthening and stretching (hamstrings, heel cord).  Emphasize L knee flexion, L hip flexion, L hip abduction, L hip extension.  Work on left stance control for improved gait patter/stability.   Consulted and Agree with Plan of Care Patient      Patient will benefit from skilled therapeutic intervention in order to improve the following deficits and impairments:  Abnormal gait, Decreased balance, Decreased mobility, Decreased strength, Difficulty walking, Impaired tone, Decreased endurance  Visit Diagnosis: Muscle weakness (generalized)  Other abnormalities of gait and mobility  Other symptoms and signs involving the nervous system     Problem List Patient Active Problem List   Diagnosis Date Noted  . Hypokalemia 11/07/2014  . Colitis 11/07/2014  . H/O Hashimoto thyroiditis   . B12 DEFICIENCY 07/27/2009  . ANEMIA, IRON DEFICIENCY 07/27/2009  . GOITER 07/26/2009  . LACTOSE INTOLERANCE 07/26/2009  . ARTHRALGIA 07/26/2009  . DIARRHEA 07/26/2009  . CHOLECYSTECTOMY, LAPAROSCOPIC, HX OF 07/26/2009    Shamicka Inga, PT 09/08/2015, 8:00 PM  [facility] [address] [place], [place], [address] Phone: [phone]   Fax:  [phone]  Name: Jo Chung MRN: [id number] Date of Birth: 07/31/1965

## 2015-09-12 ENCOUNTER — Ambulatory Visit: Payer: BLUE CROSS/BLUE SHIELD | Admitting: Physical Therapy

## 2015-09-12 DIAGNOSIS — M6281 Muscle weakness (generalized): Secondary | ICD-10-CM | POA: Diagnosis not present

## 2015-09-12 DIAGNOSIS — R2689 Other abnormalities of gait and mobility: Secondary | ICD-10-CM

## 2015-09-12 NOTE — Therapy (Signed)
[facility] [address] [place], [place], [address] Phone: [phone]   Fax:  [phone]  Physical Therapy Treatment  Patient Details  Name: Jo Chung MRN: [id number] Date of Birth: 01/19/1965 Referring Provider: Bronson Ingavid Lacey, MD  Encounter Date: 09/12/2015      PT End of Session - 09/12/15 0914    Visit Number 2   Number of Visits 16   Date for PT Re-Evaluation 10/08/15   PT Start Time 0800   PT Stop Time 0847   PT Time Calculation (min) 47 min      Past Medical History:  Diagnosis Date  . Anxiety   . Dermatosis papulosa nigra   . Fibromyalgia   . H/O Hashimoto thyroiditis   . Hypertension   . Insomnia   . Intertrigo   . LGSIL (low grade squamous intraepithelial dysplasia)   . Lupus (HCC)   . Lymphocytic colitis   . Obesity   . Stroke (HCC)   . Vitamin B 12 deficiency     Past Surgical History:  Procedure Laterality Date  . BREAST LUMPECTOMY    . BREAST REDUCTION SURGERY    . CESAREAN SECTION    . CHOLECYSTECTOMY    . URETERAL STENT PLACEMENT      There were no vitals filed for this visit.      Subjective Assessment - 09/12/15 0801    Subjective Pt reports having some pain but states it is better than "what it was"; did exercises given last session without any problem   Pertinent History March of 2015 CVA with L sided weakness.    Patient Stated Goals Improve enudrance, reduce limp on the left side.     Currently in Pain? Yes   Pain Score 4    Pain Location --  in hands and feet   Pain Orientation Right;Left   Pain Descriptors / Indicators Aching   Pain Type Chronic pain   Pain Onset More than a month ago   Pain Frequency Intermittent                         [facility] Adult PT Treatment/Exercise - 09/12/15 0001      Exercises   Exercises Lumbar;Knee/Hip;Ankle     Lumbar Exercises: Supine   Bridge 10 reps      L 1/2 bridge 10 reps L hip flexion in hooklying  with 2# weight 10 reps  Attempted L hip extension control 2# weight used for 4 reps but pt c/o L hip "popping"  L hip abduction in R sidelying with 2# weight 2 sets 10 reps L hip abduction with ER (clam shell) 2# weight 2 sets 10 reps L SLR 2# weight 10 reps  STRETCHES: L hamstring stretch seated 30 sec hold     PT Education - 09/12/15 0913    Education provided Yes   Education Details added hamstring, runners stretch, heel cord stretch to HEP:  added knee flexion seated and hip abduction in hooklying with red theraband   Person(s) Educated Patient   Methods Explanation;Demonstration;Handout   Comprehension Verbalized understanding;Returned demonstration    Runner's stretch for LLE - 30 sec hold x 1 rep L heel cord stretch in standing - using bottom shelf of cabinet - 30 sec hold x 1 reps   Leg press 50# bil. LE's 10 reps;  LLE only 30# 2 sets 10 reps SciFit level 3.0 x 5" with UE's and LE's  PT Short Term Goals - 09/08/15 1946      PT SHORT TERM GOAL #1   Title STGs=LTGs           PT Long Term Goals - 09/08/15 1946      PT LONG TERM GOAL #1   Title The patient will return demo HEP for LE strengthening, general mobility and endurance training.   Baseline Target date 10/08/2015   Time 4   Period Weeks     PT LONG TERM GOAL #2   Title The patient will improve gait speed from 1.93 ft/sec to > or equal to 2.3 ft/sec to demo improving functional mobility.   Baseline Target date 10/08/2015   Time 4   Period Weeks     PT LONG TERM GOAL #3   Title The patient will perform single leg stance on L LE x 3 seconds to demo improved stance control for gait activities.   Baseline Target date 10/08/2015   Time 4   Period Weeks     PT LONG TERM GOAL #4   Title The patient will improve L LE strength in hip flexion and knee flexion to 4/5 to demo improved strength for gait activities.   Baseline Target date 10/08/2015   Time 4   Period Weeks     PT LONG TERM GOAL #5   Title  The patient will ambulate for 10 minutes nonstop to demo improving functional endurance for community mobility.   Baseline Target date 10/08/2015   Time 4   Period Weeks               Plan - 09/12/15 0915    Clinical Impression Statement Pt presents with LLE weakness especially in L hip abductors, hamstrings and ankle musculature.  Pt was able to increase active knee flexion after approx. 6 reps with use of red theraband ( in seated position);  pt reported fatigue at end of session.  Pt c/o L hip "popping" with L hip extension control exercise so this exercise was stopped after approx. 4 reps.   Rehab Potential Good   PT Frequency 2x / week   PT Duration 4 weeks   PT Treatment/Interventions ADLs/Self Care Home Management;Therapeutic activities;Therapeutic exercise;Gait training;Functional mobility training;Patient/family education;Orthotic Fit/Training;Neuromuscular re-education   PT Next Visit Plan check exercises added to HEP (3 stretches and 2 exs with red theraband); cont LLE strengthening   Consulted and Agree with Plan of Care Patient      Patient will benefit from skilled therapeutic intervention in order to improve the following deficits and impairments:  Abnormal gait, Decreased balance, Decreased mobility, Decreased strength, Difficulty walking, Impaired tone, Decreased endurance  Visit Diagnosis: Other abnormalities of gait and mobility  Muscle weakness (generalized)     Problem List Patient Active Problem List   Diagnosis Date Noted  . Hypokalemia 11/07/2014  . Colitis 11/07/2014  . H/O Hashimoto thyroiditis   . B12 DEFICIENCY 07/27/2009  . ANEMIA, IRON DEFICIENCY 07/27/2009  . GOITER 07/26/2009  . LACTOSE INTOLERANCE 07/26/2009  . ARTHRALGIA 07/26/2009  . DIARRHEA 07/26/2009  . CHOLECYSTECTOMY, LAPAROSCOPIC, HX OF 07/26/2009    DildayDonavan Burnet, PT 09/12/2015, 9:22 AM  [facility] [address] [place], [place], [address] Phone: [phone]   Fax:  [phone]  Name: Jo Chung MRN: [id number] Date of Birth: [birth year]

## 2015-09-12 NOTE — Patient Instructions (Addendum)
HIP: Abduction - Supine (Band)    Place band around legs. Move one leg away from body. Keep knee and toes straight. Hold _3-4__ seconds. Use __RED______ band. _10__ reps per set, __3_ sets per day, _5__ days per week Move both legs away from body at same time.  DO WITH BOTH KNEES BENT (HOOKLYING POSITION) - MOVE LEFT LEG OUT TO SIDE AGAINST BAND  Copyright  VHI. All rights reserved.  Knee Flexion: Resisted (Sitting)    Sit with band under left foot and looped around ankle of supported leg. Pull unsupported leg back. Repeat ___10-15_ times per set. Do _3__ sets per session. Do ___1-2_ sessions per day. WITH RED THERABAND AROUND LEFT ANKLE http://orth.exer.us/695   Copyright  VHI. All rights reserved.  Hamstring Stretch (Sitting)    Sitting, extend one leg and place hands on same thigh for support. Keeping torso straight, lean forward, sliding hands down leg, until a stretch is felt in back of thigh. Hold ___30-60_ seconds. Repeat with other leg.  SIT IN Achilles / Gastroc, Standing    Stand, right foot behind, heel on floor and turned slightly out, leg straight, forward leg bent. Move hips forward. Hold _30-60Gastroc / Heel Cord Stretch - On Step    Stand with heels over edge of stair. Holding rail, lower heels until stretch is felt in calf of legs. Repeat __2_ times. Do _2__ times per day.  Copyright  VHI. All rights reserved.  __ seconds. Repeat _1__ times per session. Do   1-2___ sessions per day.  Copyright  VHI. All rights reserved.  CHAIR WITH BACK SUPPORT  Copyright  VHI. All rights reserved.

## 2015-09-16 ENCOUNTER — Ambulatory Visit: Payer: BLUE CROSS/BLUE SHIELD | Attending: Psychiatry | Admitting: Rehabilitative and Restorative Service Providers"

## 2015-09-16 DIAGNOSIS — R29818 Other symptoms and signs involving the nervous system: Secondary | ICD-10-CM

## 2015-09-16 DIAGNOSIS — M6281 Muscle weakness (generalized): Secondary | ICD-10-CM

## 2015-09-16 DIAGNOSIS — R2689 Other abnormalities of gait and mobility: Secondary | ICD-10-CM | POA: Diagnosis not present

## 2015-09-16 NOTE — Patient Instructions (Signed)
Walking Program:  Begin walking for exercise for 5 minutes, 3 times/day, 4-5 days/week.   Progress your walking program by adding 2 minutes to your routine each week, as tolerated. Be sure to wear good walking shoes, walk in a safe environment and only progress to your tolerance.

## 2015-09-16 NOTE — Therapy (Signed)
[facility] [address] [place], [place], [address] Phone: [phone]   Fax:  [phone]  Physical Therapy Treatment  Patient Details  Name: Jo Chung MRN: [id number] Date of Birth: 12/05/1965 Referring Provider: Bronson Ing, MD  Encounter Date: 09/16/2015      PT End of Session - 09/16/15 1206    Visit Number 3   Number of Visits 16   Date for PT Re-Evaluation 10/08/15   Authorization Type BCBS   PT Start Time 1110   PT Stop Time 1149   PT Time Calculation (min) 39 min   Equipment Utilized During Treatment Gait belt   Activity Tolerance Patient tolerated treatment well   Behavior During Therapy [facility] for tasks assessed/performed      Past Medical History:  Diagnosis Date  . Anxiety   . Dermatosis papulosa nigra   . Fibromyalgia   . H/O Hashimoto thyroiditis   . Hypertension   . Insomnia   . Intertrigo   . LGSIL (low grade squamous intraepithelial dysplasia)   . Lupus (HCC)   . Lymphocytic colitis   . Obesity   . Stroke (HCC)   . Vitamin B 12 deficiency     Past Surgical History:  Procedure Laterality Date  . BREAST LUMPECTOMY    . BREAST REDUCTION SURGERY    . CESAREAN SECTION    . CHOLECYSTECTOMY    . URETERAL STENT PLACEMENT      There were no vitals filed for this visit.      Subjective Assessment - 09/16/15 1155    Subjective The patient is doing HEP noting minimal soreness with activities.  She feels stronger since beginning activties.   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    Patient Stated Goals Improve enudrance, reduce limp on the left side.     Currently in Pain? Yes   Effect of Pain on Daily Activities PT monitoring, no goal due to chronic nature of pain                         [facility] Adult PT Treatment/Exercise - 09/16/15 1159      Ambulation/Gait   Ambulation/Gait Yes   Ambulation/Gait Assistance 6: Modified independent  (Device/Increase time)   Ambulation Distance (Feet) 250 Feet  x 2 reps on level surfaces; 5 minutes on treadmill   Assistive device None   Gait Comments Patient ambulated on treadmill with tactile cues L knee for flexion vs circumduction with extension; hip tactile cues for initiation vs external rotation to advance L LE during swing phase.  PT provided intermittent cues for repetition on treadmill x 5 minutes.  Then changed direction of treadmill belt performing 1.25 minutes walking backwards using UEs for support with verbal and tactile cues for L knee flexion to iniitate posterior step.      Self-Care   Self-Care Other Self-Care Comments   Other Self-Care Comments  The patient reported that she has regular access to gym at apartment and indoor pool.  PT recommended she continue with current HEP provided + add walking 5 minutes 3x/day to increase activity.  Once current HEP less challenging, will begin to transition to her available resources in order to gain greater carryover to home for long term success.  Her son attends session and appears very supportive to help with home exercises.      Exercises   Exercises Lumbar;Knee/Hip;Ankle     Lumbar Exercises: Supine  Bridge 10 reps   Bridge Limitations over physioball with bilateral LEs extended.     Knee/Hip Exercises: Seated   Hamstring Curl 10 reps;Strengthening;Left   Hamstring Limitations with red theraband and cues to complete full ROM     Knee/Hip Exercises: Supine   Other Supine Knee/Hip Exercises Bringing L LE onto/off of mat from hooklying position to initiate hip flexion/extension with knee flexed for carryover to gait activities.      Knee/Hip Exercises: Sidelying   Hip ABduction AROM;10 reps;Strengthening   Hip ABduction Limitations Needs cues to avoid ER and flexion of hip (tactile and verbal)                PT Education - 09/16/15 1143    Education provided Yes   Education Details Discussed equipment at  apartment gym (treadmills, bike, elliptical, leg extension machine); patient also has access to the indoor pool at her apartment.  PT added home walking and recommends current HEP at this time--will use her gym + pool as d/c plan.   Person(s) Educated Patient   Methods Explanation   Comprehension Verbalized understanding          PT Short Term Goals - 09/08/15 1946      PT SHORT TERM GOAL #1   Title STGs=LTGs           PT Long Term Goals - 09/08/15 1946      PT LONG TERM GOAL #1   Title The patient will return demo HEP for LE strengthening, general mobility and endurance training.   Baseline Target date 10/08/2015   Time 4   Period Weeks     PT LONG TERM GOAL #2   Title The patient will improve gait speed from 1.93 ft/sec to > or equal to 2.3 ft/sec to demo improving functional mobility.   Baseline Target date 10/08/2015   Time 4   Period Weeks     PT LONG TERM GOAL #3   Title The patient will perform single leg stance on L LE x 3 seconds to demo improved stance control for gait activities.   Baseline Target date 10/08/2015   Time 4   Period Weeks     PT LONG TERM GOAL #4   Title The patient will improve L LE strength in hip flexion and knee flexion to 4/5 to demo improved strength for gait activities.   Baseline Target date 10/08/2015   Time 4   Period Weeks     PT LONG TERM GOAL #5   Title The patient will ambulate for 10 minutes nonstop to demo improving functional endurance for community mobility.   Baseline Target date 10/08/2015   Time 4   Period Weeks               Plan - 09/16/15 1207    Clinical Impression Statement The patient is tolerating HEP well.  PT to progress activities to patient's tolerance.  Continue working to [employer].   PT Treatment/Interventions ADLs/Self Care Home Management;Therapeutic activities;Therapeutic exercise;Gait training;Functional mobility training;Patient/family education;Orthotic Fit/Training;Neuromuscular re-education    PT Next Visit Plan Check HEP as needed; gait training on treadmill emphasizing hip/knee position and dec'ing compensatory strategies; L leg stance activities   Consulted and Agree with Plan of Care Patient      Patient will benefit from skilled therapeutic intervention in order to improve the following deficits and impairments:  Abnormal gait, Decreased balance, Decreased mobility, Decreased strength, Difficulty walking, Impaired tone, Decreased endurance  Visit Diagnosis: Other abnormalities of  gait and mobility  Muscle weakness (generalized)  Other symptoms and signs involving the nervous system     Problem List Patient Active Problem List   Diagnosis Date Noted  . Hypokalemia 11/07/2014  . Colitis 11/07/2014  . H/O Hashimoto thyroiditis   . B12 DEFICIENCY 07/27/2009  . ANEMIA, IRON DEFICIENCY 07/27/2009  . GOITER 07/26/2009  . LACTOSE INTOLERANCE 07/26/2009  . ARTHRALGIA 07/26/2009  . DIARRHEA 07/26/2009  . CHOLECYSTECTOMY, LAPAROSCOPIC, HX OF 07/26/2009    Eliannah Hinde, PT 09/16/2015, 12:08 PM  [facility] [address] [place], [place], [address] Phone: [phone]   Fax:  [phone]  Name: Jo Chung MRN: [id number] Date of Birth: 12/19/1965

## 2015-09-21 ENCOUNTER — Ambulatory Visit: Payer: BLUE CROSS/BLUE SHIELD | Admitting: Physical Therapy

## 2015-09-21 DIAGNOSIS — R2689 Other abnormalities of gait and mobility: Secondary | ICD-10-CM | POA: Diagnosis not present

## 2015-09-21 DIAGNOSIS — M6281 Muscle weakness (generalized): Secondary | ICD-10-CM

## 2015-09-22 NOTE — Therapy (Signed)
[facility] [address] [place], [place], [address] Phone: [phone]   Fax:  [phone]  Physical Therapy Treatment  Patient Details  Name: Jo Chung MRN: [id number] Date of Birth: Jul 14, 1965 Referring Provider: Bronson Ing, MD  Encounter Date: 09/21/2015      PT End of Session - 09/22/15 1130    Visit Number 4   Number of Visits 16   Date for PT Re-Evaluation 10/08/15   Authorization Type BCBS   PT Start Time 1320   PT Stop Time 1403   PT Time Calculation (min) 43 min      Past Medical History:  Diagnosis Date  . Anxiety   . Dermatosis papulosa nigra   . Fibromyalgia   . H/O Hashimoto thyroiditis   . Hypertension   . Insomnia   . Intertrigo   . LGSIL (low grade squamous intraepithelial dysplasia)   . Lupus (HCC)   . Lymphocytic colitis   . Obesity   . Stroke (HCC)   . Vitamin B 12 deficiency     Past Surgical History:  Procedure Laterality Date  . BREAST LUMPECTOMY    . BREAST REDUCTION SURGERY    . CESAREAN SECTION    . CHOLECYSTECTOMY    . URETERAL STENT PLACEMENT      There were no vitals filed for this visit.      Subjective Assessment - 09/22/15 1123    Subjective Pt states she is working on not swinging her L leg out to side, but trying to lift it and bring it straight through when advancing leg   Pertinent History March of 2015 CVA with L sided weakness.    Patient Stated Goals Improve enudrance, reduce limp on the left side.                           [facility] Adult PT Treatment/Exercise - 09/22/15 0001      Ambulation/Gait   Ambulation/Gait Yes   Ambulation/Gait Assistance Other (comment)  tactile cue at hip to fascilitate swing through   Assistive device Other (Comment)  on treadmill   Gait Pattern Decreased stride length;Decreased stance time - left;Decreased step length - right;Decreased hip/knee flexion - left;Decreased dorsiflexion - left;Decreased  weight shift to left;Left circumduction;Poor foot clearance - left   Gait Comments Pt gait trained on treadmill at 1.3 mph forward with tactile cues at hip /intermittent cues at L knee -- to fascilitate L swing through and decr. LLE circumduction in swing phase of gait; -  7" total time:  backwards amb. on treadmill 5" at .8 mph with cues to increase L knee flexion in swing phase     Lumbar Exercises: Supine   Bridge 10 reps   Other Supine Lumbar Exercises bridging with LE's on red physioball - lifting one leg and then the other leg for trunk stabilization:     TherEx:  L heel cord stretching in standing with 2" step x 30 sec hold L knee flexion prone x 10 reps - no weight L hip abduction in R sidelying x 3 reps - no difficulty with this exercise so was discontinued after 3 reps  L hip/knee extension control with 4# weight on LLE - 10 reps Bridging with hip abduction/adduction x 5 reps; with RLE extension x 5 reps  NeuroRe-ed:  Quadriped - L hip extension - assistance to maintain this position - L knee flexion/extension x 10 reps Lifting opposite UE and LE  in quadriped position - 5 sec hold - 3 reps each side  L 1/2 kneeling - head turns side to side and up/down for balance challenge and to improve core stabilization            PT Short Term Goals - 09/08/15 1946      PT SHORT TERM GOAL #1   Title STGs=LTGs           PT Long Term Goals - 09/08/15 1946      PT LONG TERM GOAL #1   Title The patient will return demo HEP for LE strengthening, general mobility and endurance training.   Baseline Target date 10/08/2015   Time 4   Period Weeks     PT LONG TERM GOAL #2   Title The patient will improve gait speed from 1.93 ft/sec to > or equal to 2.3 ft/sec to demo improving functional mobility.   Baseline Target date 10/08/2015   Time 4   Period Weeks     PT LONG TERM GOAL #3   Title The patient will perform single leg stance on L LE x 3 seconds to demo improved stance  control for gait activities.   Baseline Target date 10/08/2015   Time 4   Period Weeks     PT LONG TERM GOAL #4   Title The patient will improve L LE strength in hip flexion and knee flexion to 4/5 to demo improved strength for gait activities.   Baseline Target date 10/08/2015   Time 4   Period Weeks     PT LONG TERM GOAL #5   Title The patient will ambulate for 10 minutes nonstop to demo improving functional endurance for community mobility.   Baseline Target date 10/08/2015   Time 4   Period Weeks               Plan - 09/22/15 1131    Clinical Impression Statement Pt able to flex L knee in open chain exercises but has more difficulty in closed chain and during gait - decreased L knee flexion and incr. circumduction noted in swing phase of gait   Rehab Potential Good   PT Frequency 2x / week   PT Duration 4 weeks   PT Treatment/Interventions ADLs/Self Care Home Management;Therapeutic activities;Therapeutic exercise;Gait training;Functional mobility training;Patient/family education;Orthotic Fit/Training;Neuromuscular re-education   PT Next Visit Plan cont LLE strengthening   Consulted and Agree with Plan of Care Patient      Patient will benefit from skilled therapeutic intervention in order to improve the following deficits and impairments:  Abnormal gait, Decreased balance, Decreased mobility, Decreased strength, Difficulty walking, Impaired tone, Decreased endurance  Visit Diagnosis: Other abnormalities of gait and mobility  Muscle weakness (generalized)     Problem List Patient Active Problem List   Diagnosis Date Noted  . Hypokalemia 11/07/2014  . Colitis 11/07/2014  . H/O Hashimoto thyroiditis   . B12 DEFICIENCY 07/27/2009  . ANEMIA, IRON DEFICIENCY 07/27/2009  . GOITER 07/26/2009  . LACTOSE INTOLERANCE 07/26/2009  . ARTHRALGIA 07/26/2009  . DIARRHEA 07/26/2009  . CHOLECYSTECTOMY, LAPAROSCOPIC, HX OF 07/26/2009    Kary Kosilday, Brionna Romanek Suzanne, PT 09/22/2015,  11:35 AM  [facility] [address] [place], [place], [address] Phone: [phone]   Fax:  [phone]  Name: Jo Chung MRN: [id number] Date of Birth: 12/28/1965

## 2015-09-23 ENCOUNTER — Ambulatory Visit: Payer: BLUE CROSS/BLUE SHIELD | Admitting: Physical Therapy

## 2015-09-23 DIAGNOSIS — M6281 Muscle weakness (generalized): Secondary | ICD-10-CM

## 2015-09-23 DIAGNOSIS — R2689 Other abnormalities of gait and mobility: Secondary | ICD-10-CM | POA: Diagnosis not present

## 2015-09-25 NOTE — Therapy (Signed)
[facility] [address] [place], [place], [address] Phone: [phone]   Fax:  [phone]  Physical Therapy Treatment  Patient Details  Name: Jo Chung MRN: [id number] Date of Birth: [birth year] Referring Provider: Bronson Ing, MD  Encounter Date: 09/23/2015      PT End of Session - 09/25/15 1153    Visit Number 5   Number of Visits 16   Date for PT Re-Evaluation 10/08/15   Authorization Type BCBS   PT Start Time 1103   PT Stop Time 1147   PT Time Calculation (min) 44 min      Past Medical History:  Diagnosis Date  . Anxiety   . Dermatosis papulosa nigra   . Fibromyalgia   . H/O Hashimoto thyroiditis   . Hypertension   . Insomnia   . Intertrigo   . LGSIL (low grade squamous intraepithelial dysplasia)   . Lupus (HCC)   . Lymphocytic colitis   . Obesity   . Stroke (HCC)   . Vitamin B 12 deficiency     Past Surgical History:  Procedure Laterality Date  . BREAST LUMPECTOMY    . BREAST REDUCTION SURGERY    . CESAREAN SECTION    . CHOLECYSTECTOMY    . URETERAL STENT PLACEMENT      There were no vitals filed for this visit.      Subjective Assessment - 09/25/15 1148    Subjective Pt reports no new changes or problems since last visit   Pertinent History March of 2015 CVA with L sided weakness.    Patient Stated Goals Improve enudrance, reduce limp on the left side.     Currently in Pain? No/denies                         [facility] Adult PT Treatment/Exercise - 09/25/15 0001      Ambulation/Gait   Ambulation/Gait Yes   Ambulation/Gait Assistance Other (comment)  tactile cue at hip to fascilitate swing through   Gait Comments gait trained on treadmill forward 1.3 mph x 5" with tactile cues at L hip;  .7 mph x 3" backward to fasciltate L knee flexion     Exercises   Exercises Lumbar;Knee/Hip;Ankle     Lumbar Exercises: Supine   Bridge 10 reps   Other Supine Lumbar Exercises  bridging with LE's on red physioball - lifting one leg and then the other leg for trunk stabilization:     Lumbar Exercises: Sidelying   Clam 10 reps   Hip Abduction 10 reps     Knee/Hip Exercises: Machines for Strengthening   Cybex Leg Press 50# bil. LE's10 reps:  35# LLE 15 reps LLE only     Knee/Hip Exercises: Standing   Heel Raises 10 reps     Quadriped position - L hip extension with knee flexed at 90 degrees - small pulses toward ceiling - 10 reps L knee flexion/extension with LLE extended - 10 reps  L hip extension control off side of mat table - L knee flexed with 2# weight 10 reps Bridging with RLE extension 5 reps: with marching 5 reps; with hip abduction/adduction 5 reps  Prone L knee flexion with 2# weight 2 sets 10 reps             PT Short Term Goals - 09/08/15 1946      PT SHORT TERM GOAL #1   Title STGs=LTGs  PT Long Term Goals - 09/08/15 1946      PT LONG TERM GOAL #1   Title The patient will return demo HEP for LE strengthening, general mobility and endurance training.   Baseline Target date 10/08/2015   Time 4   Period Weeks     PT LONG TERM GOAL #2   Title The patient will improve gait speed from 1.93 ft/sec to > or equal to 2.3 ft/sec to demo improving functional mobility.   Baseline Target date 10/08/2015   Time 4   Period Weeks     PT LONG TERM GOAL #3   Title The patient will perform single leg stance on L LE x 3 seconds to demo improved stance control for gait activities.   Baseline Target date 10/08/2015   Time 4   Period Weeks     PT LONG TERM GOAL #4   Title The patient will improve L LE strength in hip flexion and knee flexion to 4/5 to demo improved strength for gait activities.   Baseline Target date 10/08/2015   Time 4   Period Weeks     PT LONG TERM GOAL #5   Title The patient will ambulate for 10 minutes nonstop to demo improving functional endurance for community mobility.   Baseline Target date 10/08/2015    Time 4   Period Weeks               Plan - 09/25/15 1153    Clinical Impression Statement Pt's gait pattern improved today (prior to fatigue) as pt demonstrating increased L knee flexion in swing phase:  pt also able to perform prone L knee flexion with 2# weight today as she was not able to do this exercise with resistance in previous session    Rehab Potential Good   PT Frequency 2x / week   PT Duration 4 weeks   PT Treatment/Interventions ADLs/Self Care Home Management;Therapeutic activities;Therapeutic exercise;Gait training;Functional mobility training;Patient/family education;Orthotic Fit/Training;Neuromuscular re-education   PT Next Visit Plan cont LLE strengthening   Consulted and Agree with Plan of Care Patient      Patient will benefit from skilled therapeutic intervention in order to improve the following deficits and impairments:  Abnormal gait, Decreased balance, Decreased mobility, Decreased strength, Difficulty walking, Impaired tone, Decreased endurance  Visit Diagnosis: Other abnormalities of gait and mobility  Muscle weakness (generalized)     Problem List Patient Active Problem List   Diagnosis Date Noted  . Hypokalemia 11/07/2014  . Colitis 11/07/2014  . H/O Hashimoto thyroiditis   . B12 DEFICIENCY 07/27/2009  . ANEMIA, IRON DEFICIENCY 07/27/2009  . GOITER 07/26/2009  . LACTOSE INTOLERANCE 07/26/2009  . ARTHRALGIA 07/26/2009  . DIARRHEA 07/26/2009  . CHOLECYSTECTOMY, LAPAROSCOPIC, HX OF 07/26/2009    Kary Kosilday, Monice Lundy Suzanne, PT 09/25/2015, 11:57 AM  [facility] [address] [place], [place], [address] Phone: [phone]   Fax:  [phone]  Name: Thomasenia SalesDonna J Truex MRN: [id number] Date of Birth: 03/07/1965

## 2015-09-27 ENCOUNTER — Ambulatory Visit: Payer: BLUE CROSS/BLUE SHIELD | Admitting: Physical Therapy

## 2015-09-27 DIAGNOSIS — M6281 Muscle weakness (generalized): Secondary | ICD-10-CM

## 2015-09-27 DIAGNOSIS — R2689 Other abnormalities of gait and mobility: Secondary | ICD-10-CM | POA: Diagnosis not present

## 2015-09-27 NOTE — Therapy (Signed)
[facility] [address] [place], [place], [address] Phone: [phone]   Fax:  [phone]  Physical Therapy Treatment  Patient Details  Name: Jo Chung MRN: [id number] Date of Birth: 12/22/1965 Referring Provider: Bronson Ing, MD  Encounter Date: 09/27/2015      PT End of Session - 09/27/15 1028    Visit Number 6   Number of Visits 16   Date for PT Re-Evaluation 10/08/15   Authorization Type BCBS   PT Start Time 0847   PT Stop Time 0932   PT Time Calculation (min) 45 min      Past Medical History:  Diagnosis Date  . Anxiety   . Dermatosis papulosa nigra   . Fibromyalgia   . H/O Hashimoto thyroiditis   . Hypertension   . Insomnia   . Intertrigo   . LGSIL (low grade squamous intraepithelial dysplasia)   . Lupus (HCC)   . Lymphocytic colitis   . Obesity   . Stroke (HCC)   . Vitamin B 12 deficiency     Past Surgical History:  Procedure Laterality Date  . BREAST LUMPECTOMY    . BREAST REDUCTION SURGERY    . CESAREAN SECTION    . CHOLECYSTECTOMY    . URETERAL STENT PLACEMENT      There were no vitals filed for this visit.      Subjective Assessment - 09/27/15 1020    Subjective Pt reports she is in alot of pain today - from neck down -- reports that this happens sometimes "nothing new"   Pertinent History March of 2015 CVA with L sided weakness.    Patient Stated Goals Improve enudrance, reduce limp on the left side.                           [facility] Adult PT Treatment/Exercise - 09/27/15 0904      Ambulation/Gait   Ambulation/Gait Yes   Assistive device Other (Comment)  treadmill   Gait Pattern Decreased stride length;Decreased stance time - left;Decreased step length - right;Decreased hip/knee flexion - left;Decreased dorsiflexion - left;Decreased weight shift to left;Left circumduction;Poor foot clearance - left   Gait Comments gait trained on treadmill forward 1.3 mph  x 5" with tactile cues at L hip;  .7 mph x 3" backward to fasciltate L knee flexion     Lumbar Exercises: Standing   Heel Raises 10 reps  LLE only off hi/low mat table for closed chain strengthening     Lumbar Exercises: Supine   Other Supine Lumbar Exercises bridging with LE's on red physioball - lifting one leg and then the other leg for trunk stabilization:     Lumbar Exercises: Sidelying   Clam 10 reps   Hip Abduction 10 reps  2# weight used on LLE     Knee/Hip Exercises: Prone   Hamstring Curl 1 set;10 reps  2# weight     NeuroRe-ed:  Tall kneeling - squats toward L heel with return to upright x 10 reps for balance and core stabilization In L 1/2 kneeling - with UE support prn - with horizontal head turns 10 reps with SBA Amb. Forward and then backward in tall kneeling position - 2 reps on red mat on floor  Quadriped position - lifting RLE with LUE for trunk stabilization - 5 sec hold 3 reps  TherEx; L hip/knee extension control off side of mat - with 2# weight 2 sets 10 reps L  knee flexion prone - no weight used- 10 reps for quick L knee flexion/extension for improved coordination/control   L hip/knee flexion (in supine) with extension with cues for control - 10 reps           PT Short Term Goals - 09/08/15 1946      PT SHORT TERM GOAL #1   Title STGs=LTGs           PT Long Term Goals - 09/08/15 1946      PT LONG TERM GOAL #1   Title The patient will return demo HEP for LE strengthening, general mobility and endurance training.   Baseline Target date 10/08/2015   Time 4   Period Weeks     PT LONG TERM GOAL #2   Title The patient will improve gait speed from 1.93 ft/sec to > or equal to 2.3 ft/sec to demo improving functional mobility.   Baseline Target date 10/08/2015   Time 4   Period Weeks     PT LONG TERM GOAL #3   Title The patient will perform single leg stance on L LE x 3 seconds to demo improved stance control for gait activities.   Baseline  Target date 10/08/2015   Time 4   Period Weeks     PT LONG TERM GOAL #4   Title The patient will improve L LE strength in hip flexion and knee flexion to 4/5 to demo improved strength for gait activities.   Baseline Target date 10/08/2015   Time 4   Period Weeks     PT LONG TERM GOAL #5   Title The patient will ambulate for 10 minutes nonstop to demo improving functional endurance for community mobility.   Baseline Target date 10/08/2015   Time 4   Period Weeks               Plan - 09/27/15 1029    Clinical Impression Statement Pt improving with increasing L hamstring strength - able to perform L knee flexion in prone position with 2# weight with good eccentric control; pt also demonstrating increased knee flexion in swing phase of gait on treadmill with tactile cues for L pelvic depression to fascilitate hamstrings in swing phase of gait   Rehab Potential Good   PT Frequency 2x / week   PT Duration 4 weeks   PT Treatment/Interventions ADLs/Self Care Home Management;Therapeutic activities;Therapeutic exercise;Gait training;Functional mobility training;Patient/family education;Orthotic Fit/Training;Neuromuscular re-education   PT Next Visit Plan cont LLE strengthening   Consulted and Agree with Plan of Care Patient      Patient will benefit from skilled therapeutic intervention in order to improve the following deficits and impairments:  Abnormal gait, Decreased balance, Decreased mobility, Decreased strength, Difficulty walking, Impaired tone, Decreased endurance  Visit Diagnosis: Other abnormalities of gait and mobility  Muscle weakness (generalized)     Problem List Patient Active Problem List   Diagnosis Date Noted  . Hypokalemia 11/07/2014  . Colitis 11/07/2014  . H/O Hashimoto thyroiditis   . B12 DEFICIENCY 07/27/2009  . ANEMIA, IRON DEFICIENCY 07/27/2009  . GOITER 07/26/2009  . LACTOSE INTOLERANCE 07/26/2009  . ARTHRALGIA 07/26/2009  . DIARRHEA 07/26/2009   . CHOLECYSTECTOMY, LAPAROSCOPIC, HX OF 07/26/2009    Kary Kosilday, Alinda Egolf Suzanne, PT 09/27/2015, 10:34 AM  [facility] [address] [place], [place], [address] Phone: [phone]   Fax:  [phone]  Name: Jo Chung MRN: [id number] Date of Birth: 04/04/1965

## 2015-09-28 ENCOUNTER — Ambulatory Visit: Payer: BLUE CROSS/BLUE SHIELD | Admitting: Physical Therapy

## 2015-09-30 ENCOUNTER — Ambulatory Visit: Payer: BLUE CROSS/BLUE SHIELD | Admitting: Rehabilitative and Restorative Service Providers"

## 2015-09-30 DIAGNOSIS — R2689 Other abnormalities of gait and mobility: Secondary | ICD-10-CM | POA: Diagnosis not present

## 2015-09-30 DIAGNOSIS — M6281 Muscle weakness (generalized): Secondary | ICD-10-CM

## 2015-09-30 DIAGNOSIS — R29818 Other symptoms and signs involving the nervous system: Secondary | ICD-10-CM

## 2015-09-30 NOTE — Therapy (Signed)
[facility] [address] [place], [place], [address] Phone: [phone]   Fax:  [phone]  Physical Therapy Treatment  Patient Details  Name: Jo Chung MRN: [id number] Date of Birth: Jan 07, 1966 Referring Provider: Bronson Ing, MD  Encounter Date: 09/30/2015      PT End of Session - 09/30/15 1214    Visit Number 7   Number of Visits 16   Date for PT Re-Evaluation 10/08/15   Authorization Type BCBS   PT Start Time 0934   PT Stop Time 1018   PT Time Calculation (min) 44 min   Equipment Utilized During Treatment --   Activity Tolerance Patient tolerated treatment well   Behavior During Therapy [facility] for tasks assessed/performed      Past Medical History:  Diagnosis Date  . Anxiety   . Dermatosis papulosa nigra   . Fibromyalgia   . H/O Hashimoto thyroiditis   . Hypertension   . Insomnia   . Intertrigo   . LGSIL (low grade squamous intraepithelial dysplasia)   . Lupus (HCC)   . Lymphocytic colitis   . Obesity   . Stroke (HCC)   . Vitamin B 12 deficiency     Past Surgical History:  Procedure Laterality Date  . BREAST LUMPECTOMY    . BREAST REDUCTION SURGERY    . CESAREAN SECTION    . CHOLECYSTECTOMY    . URETERAL STENT PLACEMENT      There were no vitals filed for this visit.      Subjective Assessment - 09/30/15 1212    Subjective The patient reports she is having generalized pain today.  She notes her L leg is getting stronger with PT.   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    Patient Stated Goals Improve enudrance, reduce limp on the left side.     Currently in Pain? Yes   Pain Score --  generalized muscle pain   Pain Location Generalized   Pain Descriptors / Indicators Aching   Pain Type Chronic pain   Pain Onset More than a month ago   Pain Frequency Intermittent   Effect of Pain on Daily Activities PT to monitor pain, no intervention for pain due to generalized  pain and chronic nature              [facility] Adult PT Treatment/Exercise - 09/30/15 1009      Ambulation/Gait   Ambulation/Gait Yes   Ambulation/Gait Assistance 6: Modified independent (Device/Increase time)  uses L AFO   Ambulation Distance (Feet) 500 Feet   Assistive device Other (Comment)  treadmill with UE support   Gait Comments Treadmill x 6 minutes nonstop working on 1.48mph-1.8 mph with knee flexion and hip initiation.  Also perfomred with 2% incline with increased tactile cues on L hip initiation for improved toe clearance.    Engaged L UE during gait carrying a dowel posteriorly with bilateral UEs to engage scapular depression/retraction, which helps normalize gait pattern.     Neuro Re-ed    Neuro Re-ed Details  Lateral weight shifting R<>L without shoe donned emphasizing push off through L toes and knee extension.  Ant/posterior weight shifting pushing off toes to increase anterior translation of weight.       Exercises   Exercises Other Exercises   Other Exercises  Deep squats x 10 reps emphasizing sitting posteriorly keeping weight through heels; squat with rotation upon return to stand reaching R and L sides.  Toe flexion/extension over  tennis ball x 5 reps, ankle DF against red therband x 10 reps, ankle eversion with red theraband x 10 reps.  PT to add HEP for ankle/toes (need to consolidate program for improved compliance).                    PT Short Term Goals - 09/08/15 1946      PT SHORT TERM GOAL #1   Title STGs=LTGs           PT Long Term Goals - 09/08/15 1946      PT LONG TERM GOAL #1   Title The patient will return demo HEP for LE strengthening, general mobility and endurance training.   Baseline Target date 10/08/2015   Time 4   Period Weeks     PT LONG TERM GOAL #2   Title The patient will improve gait speed from 1.93 ft/sec to > or equal to 2.3 ft/sec to demo improving functional mobility.   Baseline Target date 10/08/2015   Time 4    Period Weeks     PT LONG TERM GOAL #3   Title The patient will perform single leg stance on L LE x 3 seconds to demo improved stance control for gait activities.   Baseline Target date 10/08/2015   Time 4   Period Weeks     PT LONG TERM GOAL #4   Title The patient will improve L LE strength in hip flexion and knee flexion to 4/5 to demo improved strength for gait activities.   Baseline Target date 10/08/2015   Time 4   Period Weeks     PT LONG TERM GOAL #5   Title The patient will ambulate for 10 minutes nonstop to demo improving functional endurance for community mobility.   Baseline Target date 10/08/2015   Time 4   Period Weeks               Plan - 09/30/15 1214    Clinical Impression Statement The patient is able to demonstrate more normalized gait mechanics, especially during L UE extension positioning.  Patient has timing deficit noted with gait, however a portion of this may be attributed to dec'd toe off at terminal stance when wearing AFO.    PT Treatment/Interventions ADLs/Self Care Home Management;Therapeutic activities;Therapeutic exercise;Gait training;Functional mobility training;Patient/family education;Orthotic Fit/Training;Neuromuscular re-education   PT Next Visit Plan cont LLE strengthening; d/c planning   Consulted and Agree with Plan of Care Patient      Patient will benefit from skilled therapeutic intervention in order to improve the following deficits and impairments:  Abnormal gait, Decreased balance, Decreased mobility, Decreased strength, Difficulty walking, Impaired tone, Decreased endurance  Visit Diagnosis: Muscle weakness (generalized)  Other abnormalities of gait and mobility  Other symptoms and signs involving the nervous system     Problem List Patient Active Problem List   Diagnosis Date Noted  . Hypokalemia 11/07/2014  . Colitis 11/07/2014  . H/O Hashimoto thyroiditis   . B12 DEFICIENCY 07/27/2009  . ANEMIA, IRON DEFICIENCY  07/27/2009  . GOITER 07/26/2009  . LACTOSE INTOLERANCE 07/26/2009  . ARTHRALGIA 07/26/2009  . DIARRHEA 07/26/2009  . CHOLECYSTECTOMY, LAPAROSCOPIC, HX OF 07/26/2009    Kinnley Paulson, PT 09/30/2015, 12:20 PM  [facility] [address] [place], [place], [address] Phone: [phone]   Fax:  [phone]  Name: Jo SalesDonna J Chung MRN: [id number] Date of Birth: 12/16/1965

## 2015-10-03 ENCOUNTER — Ambulatory Visit: Payer: BLUE CROSS/BLUE SHIELD | Admitting: Physical Therapy

## 2015-10-03 DIAGNOSIS — R2689 Other abnormalities of gait and mobility: Secondary | ICD-10-CM

## 2015-10-03 DIAGNOSIS — M6281 Muscle weakness (generalized): Secondary | ICD-10-CM

## 2015-10-03 NOTE — Therapy (Signed)
[facility] [address] [place], [place], [address] Phone: [phone]   Fax:  [phone]  Physical Therapy Treatment  Patient Details  Name: Jo Chung MRN: [id number] Date of Birth: 12/29/1965 Referring Provider: Bronson Ingavid Lacey, MD  Encounter Date: 10/03/2015      PT End of Session - 10/03/15 1257    Visit Number 8   Number of Visits 16   Date for PT Re-Evaluation 10/08/15   Authorization Type BCBS   PT Start Time 1146   PT Stop Time 1230   PT Time Calculation (min) 44 min      Past Medical History:  Diagnosis Date  . Anxiety   . Dermatosis papulosa nigra   . Fibromyalgia   . H/O Hashimoto thyroiditis   . Hypertension   . Insomnia   . Intertrigo   . LGSIL (low grade squamous intraepithelial dysplasia)   . Lupus (HCC)   . Lymphocytic colitis   . Obesity   . Stroke (HCC)   . Vitamin B 12 deficiency     Past Surgical History:  Procedure Laterality Date  . BREAST LUMPECTOMY    . BREAST REDUCTION SURGERY    . CESAREAN SECTION    . CHOLECYSTECTOMY    . URETERAL STENT PLACEMENT      There were no vitals filed for this visit.      Subjective Assessment - 10/03/15 1247    Subjective Pt reports no pain today - doing exercises at home - feels that left leg continues to improve with strengthening exs.   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    Patient Stated Goals Improve enudrance, reduce limp on the left side.     Currently in Pain? No/denies                         [facility] Treatment/Exercise - 10/03/15 1217      Ambulation/Gait   Ambulation/Gait Yes   Ambulation/Gait Assistance 5: Supervision;Other (comment)  see cues given   Assistive device Other (Comment)  treadmill   Gait Pattern Decreased stride length;Decreased stance time - left;Decreased step length - right;Decreased hip/knee flexion - left;Decreased dorsiflexion - left;Decreased weight  shift to left;Left circumduction;Poor foot clearance - left   Gait Comments gait trained on treadmill forward 1.3 mph x 5" with tactile cues at L hip for retraction and pelvic depression;  .7 mph x 3" backward with tactile cues to fasciltate L knee flexion                                            Lumbar Exercises: Standing   Heel Raises 10 reps     Lumbar Exercises: Supine   Clam 10 reps  with green theraband - LLE   Bridge 10 reps   Other Supine Lumbar Exercises bridging with LE's on red physioball - lifting one leg and then the other leg for trunk stabilization:     Lumbar Exercises: Sidelying   Hip Abduction 10 reps;Other (comment)  Green theraband used -  LLE     Knee/Hip Exercises: Machines for Strengthening   Cybex Leg Press 50# bil. LE's 15 reps:  30# LLE only 3 sets 10 reps     TherEx; L knee flexion prone - 4# weight 10 reps; quadriped position - L knee flexion/extension  with LLE in  Hip extension - pt Needs min to mod assist to keep L leg lifted for this exercise L hip extension with L knee flexed to 90 degrees - 10 reps (in quadriped position) L ankle dorsiflexion assisted - pt seated with L knee extended - used green theraband for L plantarflexion - 15 reps Bridging with hip abduction/adduction 5 reps L hip flexion/extension with green theraband with knee flexed - pt in supine position - 10 reps  TherAct:  Pt sitting on rolling stool - pulling self forward - approx. 73' with LLE - for hamstring strengthening - pt fatigued with this activity After 50'         PT Education - 10/03/15 1256    Education provided Yes   Education Details progressed PRE's in HEP to green theraband from red band   Person(s) Educated Patient   Methods Explanation   Comprehension Verbalized understanding          PT Short Term Goals - 09/08/15 1946      PT SHORT TERM GOAL #1   Title STGs=LTGs           PT Long Term Goals - 09/08/15 1946      PT LONG TERM GOAL #1   Title  The patient will return demo HEP for LE strengthening, general mobility and endurance training.   Baseline Target date 10/08/2015   Time 4   Period Weeks     PT LONG TERM GOAL #2   Title The patient will improve gait speed from 1.93 ft/sec to > or equal to 2.3 ft/sec to demo improving functional mobility.   Baseline Target date 10/08/2015   Time 4   Period Weeks     PT LONG TERM GOAL #3   Title The patient will perform single leg stance on L LE x 3 seconds to demo improved stance control for gait activities.   Baseline Target date 10/08/2015   Time 4   Period Weeks     PT LONG TERM GOAL #4   Title The patient will improve L LE strength in hip flexion and knee flexion to 4/5 to demo improved strength for gait activities.   Baseline Target date 10/08/2015   Time 4   Period Weeks     PT LONG TERM GOAL #5   Title The patient will ambulate for 10 minutes nonstop to demo improving functional endurance for community mobility.   Baseline Target date 10/08/2015   Time 4   Period Weeks               Plan - 10/03/15 1258    Clinical Impression Statement Pt is improving with increasing LLE strength - now able to perform L knee flexion with 4# weight (incr. from 2# weight) in prone position; cues given on treadmill help to decrease gait deviaitons during this acitivity - incr. L pelvic retraction with LLE circumduction noted without tactile cues    Rehab Potential Good   PT Frequency 2x / week   PT Duration 4 weeks   PT Treatment/Interventions ADLs/Self Care Home Management;Therapeutic activities;Therapeutic exercise;Gait training;Functional mobility training;Patient/family education;Orthotic Fit/Training;Neuromuscular re-education   PT Next Visit Plan cont LLE strengthening; d/c planning; consolidate HEP adding ankle control?   Consulted and Agree with Plan of Care Patient      Patient will benefit from skilled therapeutic intervention in order to improve the following deficits and  impairments:  Abnormal gait, Decreased balance, Decreased mobility, Decreased strength, Difficulty walking, Impaired tone, Decreased  endurance  Visit Diagnosis: Muscle weakness (generalized)  Other abnormalities of gait and mobility     Problem List Patient Active Problem List   Diagnosis Date Noted  . Hypokalemia 11/07/2014  . Colitis 11/07/2014  . H/O Hashimoto thyroiditis   . B12 DEFICIENCY 07/27/2009  . ANEMIA, IRON DEFICIENCY 07/27/2009  . GOITER 07/26/2009  . LACTOSE INTOLERANCE 07/26/2009  . ARTHRALGIA 07/26/2009  . DIARRHEA 07/26/2009  . CHOLECYSTECTOMY, LAPAROSCOPIC, HX OF 07/26/2009    Kary Kos, PT 10/03/2015, 1:06 PM  [facility] [address] [place], [place], [address] Phone: [phone]   Fax:  [phone]  Name: Jo Chung MRN: [id number] Date of Birth: Sep 01, 1965

## 2015-10-05 ENCOUNTER — Ambulatory Visit: Payer: BLUE CROSS/BLUE SHIELD | Admitting: Rehabilitative and Restorative Service Providers"

## 2015-10-05 DIAGNOSIS — M6281 Muscle weakness (generalized): Secondary | ICD-10-CM

## 2015-10-05 DIAGNOSIS — R29818 Other symptoms and signs involving the nervous system: Secondary | ICD-10-CM

## 2015-10-05 DIAGNOSIS — R2689 Other abnormalities of gait and mobility: Secondary | ICD-10-CM

## 2015-10-05 NOTE — Patient Instructions (Signed)
POOL ROUTINE (1-2 DAYS/WEEK):  1) Walk in the water for 10 minutes. Focus on:  Bending the left knee during walking and lifting left toes to let heel contact the floor first.  2) Exercises in the pool: 4 Count Kick    In vertical position, bring one hip and knee up in a march, straighten leg forward, keeping toes pointed up. Return to 90 then straighten leg downward. Perform _10-20__ times alternating legs.  Copyright  VHI. All rights reserved.  Catch and Reach Walking    Stepping forward, with same side hand catch opposite foot bringing heel toward buttock. Keep thigh vertical. At the same time reach other hand straight up. Alternate arms and legs. Perform _1-2__ laps.   Copyright  VHI. All rights reserved.    Leg Kick / Bend / [employer]chExtend Behind    Hold edge of pool with outside leg extended behind. Kick outside leg forward, keeping knee slightly bent. Lift leg and bend knee more. Extend leg back to start position. Repeat _10__ times. Face other direction and do with other leg.   Copyright  VHI. All rights reserved.   Frog jump    In a continuous motion, push up on toes, and pull knees up to chest in a leap forward. Straighten legs to stand. Repeat __10_ times per session.   Copyright  VHI. All rights reserved.  Corner / Ladder Flutter Kick    Start in vertical position, legs together, back in corner, or against ladder. Perform flutter kicking.   Can do scissor kicks (bringing legs out and back in) and also bicycle motion of the legs. Perform __10_ reps.  Copyright  VHI. All rights reserved.   GYM ROUTINE (1-2 DAYS/WEEK):  1) Treadmill x 10 minutes holding with both hands and using safety switch (lanyard) working up to 1.8 mph and 3% incline.  2) Elliptical:  Start with 1-2 minutes working up to 5 minutes over time.  3) Leg strengthening:   Knee curl (leg curl machine) x 10-12 repetitions (10 lbs with only left leg if the machine is set up that way) Knee  extension machine x 10-12 repetitions (20 lbs trying to use only left leg if the machine is set up that way) Leg press x 10-12 repetitions   Begin with one set and over time can work up to 2 sets of strengthening.   WHEN EXERCISING AT HOME:  Balance: One-Leg    Stand on left leg. Use hand supports. Let go with left hand, then right hand. Support as little as possible but as much as needed. Stand _10__ seconds. Repeat, standing on right leg.  Repeat 3 times on the left leg x 2 times/day.  Copyright  VHI. All rights reserved.   HIP: Abduction - Supine (Band)    Place band around legs. Move one leg away from body. Keep knee and toes straight. Hold _3-4__ seconds. Use __RED______ band. _10__ reps per set, __3_ sets per day, _5__ days per week Move both legs away from body at same time.  DO WITH BOTH KNEES BENT (HOOKLYING POSITION) - MOVE LEFT LEG OUT TO SIDE AGAINST BAND  Copyright  VHI. All rights reserved.  Knee Flexion: Resisted (Sitting)    Sit with band under left foot and looped around ankle of supported leg. Pull unsupported leg back. Repeat ___10-15_ times per set. Do _3__ sets per session. Do ___1-2_ sessions per day. WITH RED THERABAND AROUND LEFT ANKLE http://orth.exer.us/695   Copyright  VHI. All rights reserved.  Hamstring  Stretch (Sitting)    Sitting, extend one leg and place hands on same thigh for support. Keeping torso straight, lean forward, sliding hands down leg, until a stretch is felt in back of thigh. Hold ___30-60_ seconds. Repeat with other leg.   Heel Cord Stretch - On Step    Stand with heels over edge of stair. Holding rail, lower heels until stretch is felt in calf of legs. Repeat __2_ times. Do _2__ times per day.  Copyright  VHI. All rights reserved.  _      09/16/2015    Mea Ozga, PT9/01/2015 11:47 AM Signed Walking Program:  Begin walking for exercise for 5 minutes, 3 times/day, 4-5 days/week.   Progress  your walking program by adding 2 minutes to your routine each week, as tolerated. Be sure to wear good walking shoes, walk in a safe environment and only progress to your tolerance.

## 2015-10-05 NOTE — Therapy (Signed)
[facility] [address] [place], [place], [address] Phone: [phone]   Fax:  [phone]  Physical Therapy Treatment  Patient Details  Name: Jo Chung MRN: [id number] Date of Birth: August 09, 1965 Referring Provider: Terese Door, MD  Encounter Date: 10/05/2015      PT End of Session - 10/05/15 1504    Visit Number 9   Number of Visits 16   Date for PT Re-Evaluation 10/08/15   Authorization Type BCBS   PT Start Time 0940   PT Stop Time 1018   PT Time Calculation (min) 38 min   Activity Tolerance Patient tolerated treatment well   Behavior During Therapy [facility] for tasks assessed/performed      Past Medical History:  Diagnosis Date  . Anxiety   . Dermatosis papulosa nigra   . Fibromyalgia   . H/O Hashimoto thyroiditis   . Hypertension   . Insomnia   . Intertrigo   . LGSIL (low grade squamous intraepithelial dysplasia)   . Lupus ([place])   . Lymphocytic colitis   . Obesity   . Stroke ([place])   . Vitamin B 12 deficiency     Past Surgical History:  Procedure Laterality Date  . BREAST LUMPECTOMY    . BREAST REDUCTION SURGERY    . CESAREAN SECTION    . CHOLECYSTECTOMY    . URETERAL STENT PLACEMENT      There were no vitals filed for this visit.      Subjective Assessment - 10/05/15 1502    Subjective The patient and PT discussed upcoming d/c.  She requested to cancel next week to allow time for her to try gym routine and then return in 2 weeks to ensure going well prior to d/c.  PT had her reschedule next week's visits.   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    Patient Stated Goals Improve enudrance, reduce limp on the left side.     Currently in Pain? No/denies                         [facility] Adult PT Treatment/Exercise - 10/05/15 1507      Ambulation/Gait   Ambulation/Gait Yes   Ambulation/Gait Assistance 6: Modified independent (Device/Increase time)    Ambulation Distance (Feet) --  10 minutes on treadmill x 1.8 mph with UEs   Ambulation Surface Level  treadmill     Self-Care   Self-Care Other Self-Care Comments   Other Self-Care Comments  Discussed community program and availability of pool + gym.  See patient instructions for full program for community reintegration.      Exercises   Exercises Other Exercises   Other Exercises  Elliptical x 1 minutes iwth bilateral UE support; tried on land 2 pool exercises to ensure understanding.  Discussed/ demonstrated technique of aquatics exercises and discussed goals of gym strengthening routine.  Recommended her son initially attend gym with her to ensure safe and then she can begin to go on her own (always have someone with her for swimming).                 PT Education - 10/05/15 1503    Education provided Yes   Education Details PT and patient discussed/decided on Pool Routine, Gym routine, and HEP depending on where she is exercising.   Person(s) Educated Patient   Methods Explanation;Demonstration;Handout   Comprehension Verbalized understanding;Returned demonstration  PT Short Term Goals - 09/08/15 1946      PT SHORT TERM GOAL #1   Title STGs=LTGs           PT Long Term Goals - 10/05/15 1504      PT LONG TERM GOAL #1   Title The patient will return demo HEP for LE strengthening, general mobility and endurance training.   Baseline Target date 10/08/2015   Time 4   Period Weeks   Status Achieved     PT LONG TERM GOAL #2   Title The patient will improve gait speed from 1.93 ft/sec to > or equal to 2.3 ft/sec to demo improving functional mobility.   Baseline Target date 10/08/2015   Time 4   Period Weeks   Status On-going     PT LONG TERM GOAL #3   Title The patient will perform single leg stance on L LE x 3 seconds to demo improved stance control for gait activities.   Baseline Target date 10/08/2015   Time 4   Period Weeks   Status On-going      PT LONG TERM GOAL #4   Title The patient will improve L LE strength in hip flexion and knee flexion to 4/5 to demo improved strength for gait activities.   Baseline Target date 10/08/2015   Time 4   Period Weeks   Status On-going     PT LONG TERM GOAL #5   Title The patient will ambulate for 10 minutes nonstop to demo improving functional endurance for community mobility.   Baseline met on 10/05/2015   Time 4   Period Weeks   Status Achieved               Plan - 10/05/15 1505    Clinical Impression Statement The patient met LTG for ambulation x 10 minutes on treadmill today.  She has comprehensive HEP and plans to try to implement community activities next week and then return to PT to discuss any modifications needed.  PT to check goals and d/c as indicated.    PT Treatment/Interventions ADLs/Self Care Home Management;Therapeutic activities;Therapeutic exercise;Gait training;Functional mobility training;Patient/family education;Orthotic Fit/Training;Neuromuscular re-education   PT Next Visit Plan Discuss community program + HEP, modify as needed, d/c.   Consulted and Agree with Plan of Care Patient      Patient will benefit from skilled therapeutic intervention in order to improve the following deficits and impairments:  Abnormal gait, Decreased balance, Decreased mobility, Decreased strength, Difficulty walking, Impaired tone, Decreased endurance  Visit Diagnosis: Muscle weakness (generalized)  Other abnormalities of gait and mobility  Other symptoms and signs involving the nervous system     Problem List Patient Active Problem List   Diagnosis Date Noted  . Hypokalemia 11/07/2014  . Colitis 11/07/2014  . H/O Hashimoto thyroiditis   . B12 DEFICIENCY 07/27/2009  . ANEMIA, IRON DEFICIENCY 07/27/2009  . GOITER 07/26/2009  . LACTOSE INTOLERANCE 07/26/2009  . ARTHRALGIA 07/26/2009  . DIARRHEA 07/26/2009  . CHOLECYSTECTOMY, LAPAROSCOPIC, HX OF 07/26/2009     Raudel Bazen, PT 10/05/2015, 3:10 PM  [facility] [address] [place], [place], [address] Phone: [phone]   Fax:  [phone]  Name: Jo Chung MRN: [id number] Date of Birth: August 23, 1965

## 2015-10-12 ENCOUNTER — Ambulatory Visit: Payer: BLUE CROSS/BLUE SHIELD | Admitting: Physical Therapy

## 2015-10-14 ENCOUNTER — Ambulatory Visit: Payer: BLUE CROSS/BLUE SHIELD | Admitting: Rehabilitative and Restorative Service Providers"

## 2015-10-17 ENCOUNTER — Ambulatory Visit: Payer: BLUE CROSS/BLUE SHIELD | Admitting: Rehabilitative and Restorative Service Providers"

## 2015-10-20 ENCOUNTER — Ambulatory Visit: Payer: BLUE CROSS/BLUE SHIELD | Attending: Psychiatry | Admitting: Rehabilitative and Restorative Service Providers"

## 2015-10-20 DIAGNOSIS — R29818 Other symptoms and signs involving the nervous system: Secondary | ICD-10-CM | POA: Diagnosis present

## 2015-10-20 DIAGNOSIS — R2689 Other abnormalities of gait and mobility: Secondary | ICD-10-CM | POA: Diagnosis present

## 2015-10-20 DIAGNOSIS — M6281 Muscle weakness (generalized): Secondary | ICD-10-CM | POA: Diagnosis present

## 2015-10-20 NOTE — Therapy (Signed)
[facility] [address] [place], [place], [address] Phone: [phone]   Fax:  [phone]  Physical Therapy Treatment  Patient Details  Name: Jo Chung MRN: [id number] Date of Birth: 11/16/1965 Referring Provider: Terese Door, MD  Encounter Date: 10/20/2015      PT End of Session - 10/20/15 1116    Visit Number 10   Number of Visits 16   Date for PT Re-Evaluation 10/08/15   Authorization Type BCBS   PT Start Time 1115   PT Stop Time 1140   PT Time Calculation (min) 25 min   Activity Tolerance Patient tolerated treatment well   Behavior During Therapy [facility] for tasks assessed/performed      Past Medical History:  Diagnosis Date  . Anxiety   . Dermatosis papulosa nigra   . Fibromyalgia   . H/O Hashimoto thyroiditis   . Hypertension   . Insomnia   . Intertrigo   . LGSIL (low grade squamous intraepithelial dysplasia)   . Lupus   . Lymphocytic colitis   . Obesity   . Stroke ([place])   . Vitamin B 12 deficiency     Past Surgical History:  Procedure Laterality Date  . BREAST LUMPECTOMY    . BREAST REDUCTION SURGERY    . CESAREAN SECTION    . CHOLECYSTECTOMY    . URETERAL STENT PLACEMENT      There were no vitals filed for this visit.      Subjective Assessment - 10/20/15 1114    Subjective "I feel better overall."  Patient reports that she is under stress regarding long term disability policy with work.  She reports she is going to appeal the decision.    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    Patient Stated Goals Improve enudrance, reduce limp on the left side.                           [facility] Adult PT Treatment/Exercise - 10/20/15 1225      Ambulation/Gait   Ambulation/Gait Yes   Ambulation/Gait Assistance 6: Modified independent (Device/Increase time)   Ambulation Distance (Feet) 500 Feet   Assistive device None   Ambulation Surface Outdoor;Paved   Gait Comments Patient negotiated paved outdoor surfaces with AFO and verbal cues for L knee flexion to initiate swing phase.     Self-Care   Self-Care Other Self-Care Comments   Other Self-Care Comments  Provided information for vocational rehab services.      Exercises   Exercises Other Exercises   Other Exercises  Reviewed all prior HEP and discussed continuation of activities post d/c.                   PT Short Term Goals - 09/08/15 1946      PT SHORT TERM GOAL #1   Title STGs=LTGs           PT Long Term Goals - 10/20/15 1121      PT LONG TERM GOAL #1   Title The patient will return demo HEP for LE strengthening, general mobility and endurance training.   Baseline Target date 10/08/2015   Time 4   Period Weeks   Status Achieved     PT LONG TERM GOAL #2   Title The patient will improve gait speed from 1.93 ft/sec to > or equal to 2.3 ft/sec to demo improving functional mobility.   Baseline Improved to  2.25 ft/sec on 10/20/2015   Time 4   Period Weeks   Status Partially Met     PT LONG TERM GOAL #3   Title The patient will perform single leg stance on L LE x 3 seconds to demo improved stance control for gait activities.   Baseline Performs 2 seconds on L LE.    Time 4   Period Weeks   Status Partially Met     PT LONG TERM GOAL #4   Title The patient will improve L LE strength in hip flexion and knee flexion to 4/5 to demo improved strength for gait activities.   Baseline Met on 10/20/2015 with 4/5 MMT for L hip flexors and knee flexors.     Time 4   Period Weeks   Status Achieved     PT LONG TERM GOAL #5   Title The patient will ambulate for 10 minutes nonstop to demo improving functional endurance for community mobility.   Baseline met on 10/05/2015   Time 4   Period Weeks   Status Achieved               Plan - 10/20/15 1138    Clinical Impression Statement The patient feels largest barrier is carryover to home.  PT and patient have come  up with plan to work between home, gym at apartment and pool time to provide options Met 3 LTGS and partially met 2 LTGs.     PT Treatment/Interventions ADLs/Self Care Home Management;Therapeutic activities;Therapeutic exercise;Gait training;Functional mobility training;Patient/family education;Orthotic Fit/Training;Neuromuscular re-education   PT Next Visit Plan Discharge today   Consulted and Agree with Plan of Care Patient      Patient will benefit from skilled therapeutic intervention in order to improve the following deficits and impairments:  Abnormal gait, Decreased balance, Decreased mobility, Decreased strength, Difficulty walking, Impaired tone, Decreased endurance  Visit Diagnosis: Muscle weakness (generalized)  Other abnormalities of gait and mobility  Other symptoms and signs involving the nervous system  PHYSICAL THERAPY DISCHARGE SUMMARY  Visits from Start of Care: 10  Current functional level related to goals / functional outcomes: See above   Remaining deficits: L LE weakness from h/o CVA Muscle spasticity   Education / Equipment: HEP, community wellness.   Plan: Patient agrees to discharge.  Patient goals were met. Patient is being discharged due to meeting the stated rehab goals.  ?????        Thank you for the referral of this patient. Rudell Cobb, MPT  [place], PT 10/20/2015, 12:26 PM  [facility] [address] [place], [place], [address] Phone: [phone]   Fax:  [phone]  Name: Jo Chung MRN: [id number] Date of Birth: [birth year]

## 2016-02-15 ENCOUNTER — Emergency Department (HOSPITAL_BASED_OUTPATIENT_CLINIC_OR_DEPARTMENT_OTHER)
Admit: 2016-02-15 | Discharge: 2016-02-15 | Disposition: A | Discharge status: Home / Self Care | Payer: BLUE CROSS/BLUE SHIELD

## 2016-02-15 ENCOUNTER — Encounter (HOSPITAL_COMMUNITY): Payer: Self-pay | Admitting: Emergency Medicine

## 2016-02-15 ENCOUNTER — Emergency Department (HOSPITAL_COMMUNITY)
Admission: EM | Admit: 2016-02-15 | Discharge: 2016-02-15 | Disposition: A | Discharge status: Home / Self Care | Payer: BLUE CROSS/BLUE SHIELD | Attending: Emergency Medicine | Admitting: Emergency Medicine

## 2016-02-15 DIAGNOSIS — M7122 Synovial cyst of popliteal space [Baker], left knee: Secondary | ICD-10-CM | POA: Insufficient documentation

## 2016-02-15 DIAGNOSIS — I7389 Other specified peripheral vascular diseases: Secondary | ICD-10-CM | POA: Diagnosis not present

## 2016-02-15 DIAGNOSIS — Z79899 Other long term (current) drug therapy: Secondary | ICD-10-CM | POA: Diagnosis not present

## 2016-02-15 DIAGNOSIS — Z8673 Personal history of transient ischemic attack (TIA), and cerebral infarction without residual deficits: Secondary | ICD-10-CM | POA: Diagnosis not present

## 2016-02-15 DIAGNOSIS — I739 Peripheral vascular disease, unspecified: Secondary | ICD-10-CM

## 2016-02-15 DIAGNOSIS — I10 Essential (primary) hypertension: Secondary | ICD-10-CM | POA: Insufficient documentation

## 2016-02-15 DIAGNOSIS — Z7982 Long term (current) use of aspirin: Secondary | ICD-10-CM | POA: Insufficient documentation

## 2016-02-15 DIAGNOSIS — M79605 Pain in left leg: Secondary | ICD-10-CM | POA: Diagnosis present

## 2016-02-15 LAB — COMPREHENSIVE METABOLIC PANEL
ALBUMIN: 3.7 g/dL (ref 3.5–5.0)
ALT: 20 U/L (ref 14–54)
AST: 19 U/L (ref 15–41)
Alkaline Phosphatase: 40 U/L (ref 38–126)
Anion gap: 9 (ref 5–15)
BILIRUBIN TOTAL: 0.5 mg/dL (ref 0.3–1.2)
BUN: 8 mg/dL (ref 6–20)
CO2: 24 mmol/L (ref 22–32)
Calcium: 9.6 mg/dL (ref 8.9–10.3)
Chloride: 105 mmol/L (ref 101–111)
Creatinine, Ser: 0.45 mg/dL (ref 0.44–1.00)
GFR calc Af Amer: >60 mL/min (ref >60)
GFR calc non Af Amer: >60 mL/min (ref >60)
GLUCOSE: 97 mg/dL (ref 65–99)
POTASSIUM: 4 mmol/L (ref 3.5–5.1)
SODIUM: 138 mmol/L (ref 135–145)
TOTAL PROTEIN: 7 g/dL (ref 6.5–8.1)

## 2016-02-15 LAB — CBC WITH DIFFERENTIAL/PLATELET
BASOS ABS: 0 10*3/uL (ref 0.0–0.1)
BASOS PCT: 0 %
Eosinophils Absolute: 0.1 10*3/uL (ref 0.0–0.7)
Eosinophils Relative: 1 %
HEMATOCRIT: 37.7 % (ref 36.0–46.0)
HEMOGLOBIN: 12.2 g/dL (ref 12.0–15.0)
Lymphocytes Relative: 27 %
Lymphs Abs: 2.5 10*3/uL (ref 0.7–4.0)
MCH: 26.2 pg (ref 26.0–34.0)
MCHC: 32.4 g/dL (ref 30.0–36.0)
MCV: 80.9 fL (ref 78.0–100.0)
Monocytes Absolute: 0.4 10*3/uL (ref 0.1–1.0)
Monocytes Relative: 4 %
NEUTROS ABS: 6.2 10*3/uL (ref 1.7–7.7)
NEUTROS PCT: 68 %
Platelets: 284 10*3/uL (ref 150–400)
RBC: 4.66 MIL/uL (ref 3.87–5.11)
RDW: 12.8 % (ref 11.5–15.5)
WBC: 9.1 10*3/uL (ref 4.0–10.5)

## 2016-02-15 MED ORDER — OXYCODONE HCL 5 MG PO TABS: 5.0000 mg | ORAL_TABLET | Freq: Four times a day (QID) | ORAL | 0 refills | Status: AC | PRN

## 2016-02-15 MED ORDER — OXYCODONE-ACETAMINOPHEN 5-325 MG PO TABS
1.0000 | ORAL_TABLET | Freq: Once | ORAL | Status: AC
  Administered 2016-02-15: 1 via ORAL
  Filled 2016-02-15: qty 1

## 2016-02-15 NOTE — ED Provider Notes (Addendum)
[facility]-EMERGENCY DEPT Provider Note   CSN: [id number] Arrival date & time: 02/15/16  1739     History   Chief Complaint Chief Complaint  Patient presents with  . Leg Pain    HPI Jo Chung is a 51 y.o. female hx of HTN, lupus, previous stroke with R ptosis and L leg foot drop with leg brace here with L leg pain. Patient states that she has been having left leg pain for the last several days. Patient states that it is worse when she walks. Patient also felt that the left leg is colder than the right. Patient states that she had L foot drop since her stroke. States that her left leg is slightly more swollen as well. Denies any recent travel. Patient not on blood thinners and has no hx of DVT   The history is provided by the patient.    Past Medical History:  Diagnosis Date  . Anxiety   . Dermatosis papulosa nigra   . Fibromyalgia   . H/O Hashimoto thyroiditis   . Hypertension   . Insomnia   . Intertrigo   . LGSIL (low grade squamous intraepithelial dysplasia)   . Lupus   . Lymphocytic colitis   . Obesity   . Stroke ([facility])   . Vitamin B 12 deficiency     Patient Active Problem List   Diagnosis Date Noted  . Hypokalemia 11/07/2014  . Colitis 11/07/2014  . H/O Hashimoto thyroiditis   . B12 DEFICIENCY 07/27/2009  . ANEMIA, IRON DEFICIENCY 07/27/2009  . GOITER 07/26/2009  . LACTOSE INTOLERANCE 07/26/2009  . ARTHRALGIA 07/26/2009  . DIARRHEA 07/26/2009  . CHOLECYSTECTOMY, LAPAROSCOPIC, HX OF 07/26/2009    Past Surgical History:  Procedure Laterality Date  . BREAST LUMPECTOMY    . BREAST REDUCTION SURGERY    . CESAREAN SECTION    . CHOLECYSTECTOMY    . URETERAL STENT PLACEMENT      OB History    No data available       Home Medications    Prior to Admission medications   Medication Sig Start Date End Date Taking? Authorizing Provider  aspirin EC 81 MG tablet Take 81 mg by mouth daily. Reported on 02/17/2015    Historical Provider, MD  baclofen (LIORESAL)  10 MG tablet Take 10 mg by mouth 4 (four) times daily as needed for muscle spasms.    Historical Provider, MD  budesonide (ENTOCORT EC) 3 MG 24 hr capsule Take 3 mg by mouth daily.    Historical Provider, MD  cyanocobalamin (,VITAMIN B-12,) 1000 MCG/ML injection Inject 1 mL into the muscle once a week. 11/27/13   Historical Provider, MD  diazepam (VALIUM) 5 MG tablet Take 1 tablet (5 mg total) by mouth every 8 (eight) hours as needed for muscle spasms. 02/17/15   Charlestine Night, PA-C  dicyclomine (BENTYL) 10 MG capsule Take 10 mg by mouth 4 (four) times daily -  with meals and at bedtime.    Historical Provider, MD  ergocalciferol (VITAMIN D2) 50000 UNITS capsule Take 50,000 Units by mouth once a week. On monday    Historical Provider, MD  lisinopril (PRINIVIL,ZESTRIL) 20 MG tablet Take 20 mg by mouth daily. Reported on 02/17/2015    Historical Provider, MD  LORazepam (ATIVAN) 0.5 MG tablet Take 1 tablet by mouth 2 (two) times daily as needed. anxiety 12/02/14   Historical Provider, MD  oxyCODONE (ROXICODONE) 5 MG immediate release tablet Take 1 tablet (5 mg total) by mouth every 6 (six) hours  as needed for severe pain. 02/15/16   Jo Panderavid Hsienta Yao, MD  oxyCODONE-acetaminophen (PERCOCET/ROXICET) 5-325 MG tablet Take 1 tablet by mouth every 6 (six) hours as needed for severe pain. 02/17/15   Charlestine Nighthristopher Lawyer, PA-C  predniSONE (DELTASONE) 50 MG tablet Take 1 tablet (50 mg total) by mouth daily with breakfast. 02/17/15   Charlestine Nighthristopher Lawyer, PA-C  sertraline (ZOLOFT) 100 MG tablet Take 100 mg by mouth daily.    Historical Provider, MD  thyroid (ARMOUR) 15 MG tablet Take 15 mg by mouth daily.    Historical Provider, MD  thyroid (ARMOUR) 90 MG tablet Take 90 mg by mouth daily.    Historical Provider, MD  triamcinolone ointment (KENALOG) 0.5 % Apply 1 application topically 2 (two) times daily as needed (for rash).    Historical Provider, MD  Vitamin D, Ergocalciferol, (DRISDOL) 50000 units CAPS capsule Take 1  capsule by mouth once a week. 04/28/14   Historical Provider, MD  zolpidem (AMBIEN) 10 MG tablet Take 10 mg by mouth at bedtime as needed for sleep.    Historical Provider, MD    Family History No family history on file.  Social History Social History  Substance Use Topics  . Smoking status: Never Smoker  . Smokeless tobacco: Never Used  . Alcohol use No     Allergies   Ibuprofen and Codeine   Review of Systems Review of Systems  Musculoskeletal:       L calf pain   All other systems reviewed and are negative.    Physical Exam Updated Vital Signs BP 148/64 (BP Location: Right Arm)   Pulse 84   Temp 98 F (36.7 C) (Oral)   Resp 18   SpO2 100%   Physical Exam  Constitutional: She is oriented to person, place, and time. She appears well-developed.  Chronically ill, slightly uncomfortable   HENT:  Head: Normocephalic.  Eyes: EOM are normal. Pupils are equal, round, and reactive to light.  Neck: Normal range of motion. Neck supple.  Cardiovascular: Normal rate.   Pulmonary/Chest: Effort normal and breath sounds normal. No respiratory distress. She has no wheezes.  Abdominal: Soft. Bowel sounds are normal. She exhibits no distension. There is no tenderness.  Musculoskeletal:  L leg slightly swollen. Mild calf tenderness. L lower leg slightly colder than right but good capillary refill. Great 2 + DP pulse. Able to wiggle toes. Has L foot drop (chronic since previous stroke). R leg exam unremarkable   Neurological: She is alert and oriented to person, place, and time.  Skin: Skin is warm.  Psychiatric: She has a normal mood and affect.  Nursing note and vitals reviewed.    ED Treatments / Results  Labs (all labs ordered are listed, but only abnormal results are displayed) Labs Reviewed  CBC WITH DIFFERENTIAL/PLATELET  COMPREHENSIVE METABOLIC PANEL    EKG  EKG Interpretation None       Radiology No results found.  Procedures Procedures (including  critical care time)  Medications Ordered in ED Medications  oxyCODONE-acetaminophen (PERCOCET/ROXICET) 5-325 MG per tablet 1 tablet (not administered)     Initial Impression / Assessment and Plan / ED Course  I have reviewed the triage vital signs and the nursing notes.  Pertinent labs & imaging results that were available during my care of the patient were reviewed by me and considered in my medical decision making (see chart for details).    Jo SalesDonna J Osterman is a 51 y.o. female here with L leg pain. L leg is  slightly colder than right but has great DP pulse. Consider DVT vs claudication. Will get labs, DVT study.   8:30 PM DVT study neg, showed baker's cyst. Labs unremarkable. I think symptoms likely from claudication. She has family members who have peripheral vascular disease who lost limbs before. She is already on ASA 81 mg daily. She is on lisinopril to control her BP. She is not a smoker. I talked to Dr. Arbie Cookey, who took down her info and will try and contact her tomorrow to schedule appointment later this week. I told her to call Dr. Bosie Helper office tomorrow if she doesn't hear from his office and continue current meds. If her leg became more cold or numb that she needs to return immediately. Given that she has a great DP pulse currently, will not need emergent angiogram but should get further evaluation by vascular surgery. Will prescribe oxycodone for pain.    Final Clinical Impressions(s) / ED Diagnoses   Final diagnoses:  Baker's cyst of knee, left  Claudication in peripheral vascular disease (HCC)    New Prescriptions New Prescriptions   OXYCODONE (ROXICODONE) 5 MG IMMEDIATE RELEASE TABLET    Take 1 tablet (5 mg total) by mouth every 6 (six) hours as needed for severe pain.     Jo Pander, MD 02/15/16 2033    Jo Pander, MD 02/15/16 2045

## 2016-02-15 NOTE — Progress Notes (Signed)
*  PRELIMINARY RESULTS* Vascular Ultrasound Left lower extremity venous duplex has been completed.  Preliminary findings: No evidence of deep vein thrombosis in the left lower extremity.  Small left sided baker's cyst noted.  Preliminary results called to Dr. Clarene DukeLittle at 19:10   Chauncey Fischerharlotte C Koden Hunzeker 02/15/2016, 7:11 PM

## 2016-02-15 NOTE — ED Triage Notes (Signed)
Pt c/o left leg pain in calf, with walking and at rest-- left leg cooler in temp than right-- positive pedal pulse-- pt had a stroke 3 years ago. Has a brace on left lower leg--

## 2016-02-15 NOTE — Discharge Instructions (Signed)
Take oxycodone as needed for pain.   Keep leg elevated.   Continue to take your ASA as prescribed.   You have a small baker's cyst that you can follow up with ortho.   You likely have vascular disease. Call vascular doctor (Dr. Arbie CookeyEarly) tomorrow for an appointment.   Return to ER if your foot turns even colder, loss of sensation in the foot, toes turning blue.

## 2016-02-22 ENCOUNTER — Ambulatory Visit (INDEPENDENT_AMBULATORY_CARE_PROVIDER_SITE_OTHER): Payer: Self-pay | Admitting: Orthopedic Surgery

## 2016-02-22 ENCOUNTER — Ambulatory Visit (INDEPENDENT_AMBULATORY_CARE_PROVIDER_SITE_OTHER): Payer: Medicaid Other | Admitting: Family

## 2016-02-22 DIAGNOSIS — M7122 Synovial cyst of popliteal space [Baker], left knee: Secondary | ICD-10-CM

## 2016-02-22 DIAGNOSIS — I739 Peripheral vascular disease, unspecified: Secondary | ICD-10-CM | POA: Diagnosis not present

## 2016-02-23 NOTE — Progress Notes (Signed)
Office Visit Note   Patient: Jo Chung           Date of Birth: [birth year]           MRN: [id number] Visit Date: 02/22/2016              Requested by: Ledon Snare, MD No address on file PCP: Ledon Snare, MD  Chief Complaint  Patient presents with  . Left Knee - Pain    HPI: The patient is a 51 year old woman who is seen today for evaluation of a Baker's cyst on the left. She went to the emergency room over the weekend for complaint of pain to the left as well as coolness to left lower extremity. Dopplers were performed which were negative for DVT. However they did find an incidental finding of a Baker cyst. She presents today for evaluation of Baker's cyst on the left.   Having intermittient burning, sharp pain in calf. this comes on with ambulation. Stops and resolves with rest.  Of note, does have follow-up established with vascular surgery, will be seeing them in several weeks for evaluation of the pain and coolness.    Assessment & Plan: Visit Diagnoses:  1. Baker's cyst, left   2. Left leg claudication (HCC)     Plan: Discussed the Baker's cyst. Reassurance provided. She will follow up with vascular surgery for the pain in the left lower extremity as well as coolness. Sounds like she is having claudication.  Follow-Up Instructions: Return if symptoms worsen or fail to improve.   Physical Exam  Constitutional: Appears well-developed.  Head: Normocephalic.  Eyes: EOM are normal.  Neck: Normal range of motion.  Cardiovascular: Normal rate.   Pulmonary/Chest: Effort normal.  Neurological: Is alert.  Skin: Skin is warm.  Psychiatric: Has a normal mood and affect.  Physical Exam  Constitutional: Appears well-developed.  Head: Normocephalic.  Eyes: EOM are normal.  Neck: Normal range of motion.  Cardiovascular: Normal rate.   Pulmonary/Chest: Effort normal.  Neurological: Is alert.  Skin: Skin is warm.  Psychiatric: Has a normal mood and  affect.' Left Knee Exam  Left knee exam is normal.  Tenderness  The patient is experiencing no tenderness.     Range of Motion  The patient has normal left knee ROM.  Tests  Varus: negative Valgus: negative  Other  Swelling: none Effusion: no effusion present  Comments:  Unable to palplate bakers cyst  No calf tenderness.       Imaging: No results found.  Orders:  No orders of the defined types were placed in this encounter.  No orders of the defined types were placed in this encounter.    Procedures: No procedures performed  Clinical Data: No additional findings.  Subjective: Review of Systems  Constitutional: Negative for chills and fever.  Cardiovascular: Negative for leg swelling.  Musculoskeletal: Positive for myalgias.    Objective: Vital Signs: There were no vitals taken for this visit.  Specialty Comments:  No specialty comments available.  PMFS History: Patient Active Problem List   Diagnosis Date Noted  . Hypokalemia 11/07/2014  . Colitis 11/07/2014  . H/O Hashimoto thyroiditis   . B12 DEFICIENCY 07/27/2009  . ANEMIA, IRON DEFICIENCY 07/27/2009  . GOITER 07/26/2009  . LACTOSE INTOLERANCE 07/26/2009  . ARTHRALGIA 07/26/2009  . DIARRHEA 07/26/2009  . CHOLECYSTECTOMY, LAPAROSCOPIC, HX OF 07/26/2009   Past Medical History:  Diagnosis Date  . Anxiety   . Dermatosis papulosa nigra   . Fibromyalgia   .  H/O Hashimoto thyroiditis   . Hypertension   . Insomnia   . Intertrigo   . LGSIL (low grade squamous intraepithelial dysplasia)   . Lupus   . Lymphocytic colitis   . Obesity   . Stroke ([facility])   . Vitamin B 12 deficiency     No family history on file.  Past Surgical History:  Procedure Laterality Date  . BREAST LUMPECTOMY    . BREAST REDUCTION SURGERY    . CESAREAN SECTION    . CHOLECYSTECTOMY    . URETERAL STENT PLACEMENT     Social History   Occupational History  . Not on file.   Social History Main Topics  .  Smoking status: Never Smoker  . Smokeless tobacco: Never Used  . Alcohol use No  . Drug use: No  . Sexual activity: Not on file

## 2016-03-16 ENCOUNTER — Encounter: Payer: Self-pay | Admitting: Vascular Surgery

## 2016-03-20 ENCOUNTER — Other Ambulatory Visit: Payer: Self-pay

## 2016-03-20 DIAGNOSIS — I739 Peripheral vascular disease, unspecified: Secondary | ICD-10-CM

## 2016-03-29 ENCOUNTER — Ambulatory Visit (HOSPITAL_COMMUNITY)
Admission: RE | Admit: 2016-03-29 | Discharge: 2016-03-29 | Disposition: A | Discharge status: Home / Self Care | Payer: BLUE CROSS/BLUE SHIELD | Source: Ambulatory Visit | Attending: Vascular Surgery | Admitting: Vascular Surgery

## 2016-03-29 ENCOUNTER — Encounter: Payer: Self-pay | Admitting: Vascular Surgery

## 2016-03-29 ENCOUNTER — Ambulatory Visit (INDEPENDENT_AMBULATORY_CARE_PROVIDER_SITE_OTHER): Payer: BLUE CROSS/BLUE SHIELD | Admitting: Vascular Surgery

## 2016-03-29 VITALS — BP 134/83 | HR 75 | Temp 98.8°F | Resp 18 | Ht 59.0 in | Wt 165.4 lb

## 2016-03-29 DIAGNOSIS — M79605 Pain in left leg: Secondary | ICD-10-CM | POA: Diagnosis not present

## 2016-03-29 DIAGNOSIS — I739 Peripheral vascular disease, unspecified: Secondary | ICD-10-CM | POA: Insufficient documentation

## 2016-03-29 NOTE — Progress Notes (Signed)
Referring Physician: Chaney Malling, MD  Patient name: Jo Chung MRN: [id number] DOB: Nov 02, 1965 Sex: female  REASON FOR CONSULT: Left leg pain  HPI: Jo Chung is a 51 y.o. female with a 2 month history of coolness in the left lower extremity.  She also has some pain in the left calf with walking. 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 She states that the left leg periodically will become cold. This happens almost daily. She was having some pain in the leg but this has improved with taking once daily aspirin. She does have a history of lupus. She does not smoke. She denies history of diabetes. Other medical problems include hypertension which is been stable.  Past Medical History:  Diagnosis Date  . Anxiety   . Dermatosis papulosa nigra   . Fibromyalgia   . H/O Hashimoto thyroiditis   . Hypertension   . Insomnia   . Intertrigo   . LGSIL (low grade squamous intraepithelial dysplasia)   . Lupus   . Lymphocytic colitis   . Obesity   . Stroke (HCC)   . Vitamin B 12 deficiency    Past Surgical History:  Procedure Laterality Date  . BREAST LUMPECTOMY    . BREAST REDUCTION SURGERY    . CESAREAN SECTION    . CHOLECYSTECTOMY    . URETERAL STENT PLACEMENT      No family history on file.  SOCIAL HISTORY: Social History   Social History  . Marital status: Married    Spouse name: N/A  . Number of children: N/A  . Years of education: N/A   Occupational History  . Not on file.   Social History Main Topics  . Smoking status: Never Smoker  . Smokeless tobacco: Never Used  . Alcohol use No  . Drug use: No  . Sexual activity: Not on file   Other Topics Concern  . Not on file   Social History Narrative  . No narrative on file    Allergies  Allergen Reactions  . Ibuprofen Nausea And Vomiting  . Codeine Nausea Only    Current Outpatient Prescriptions  Medication Sig Dispense Refill  . aspirin EC 81 MG tablet Take 81 mg by mouth  daily. Reported on 02/17/2015    . baclofen (LIORESAL) 10 MG tablet Take 10 mg by mouth 4 (four) times daily as needed for muscle spasms.    . budesonide (ENTOCORT EC) 3 MG 24 hr capsule Take 3 mg by mouth daily.    . cyanocobalamin (,VITAMIN B-12,) 1000 MCG/ML injection Inject 1 mL into the muscle once a week.    . dicyclomine (BENTYL) 10 MG capsule Take 10 mg by mouth 4 (four) times daily -  with meals and at bedtime.    . ergocalciferol (VITAMIN D2) 50000 UNITS capsule Take 50,000 Units by mouth once a week. On monday    . lisinopril (PRINIVIL,ZESTRIL) 20 MG tablet Take 20 mg by mouth daily. Reported on 02/17/2015    . LORazepam (ATIVAN) 0.5 MG tablet Take 1 tablet by mouth 2 (two) times daily as needed. anxiety  1  . oxyCODONE (ROXICODONE) 5 MG immediate release tablet Take 1 tablet (5 mg total) by mouth every 6 (six) hours as needed for severe pain. 10 tablet 0  . predniSONE (DELTASONE) 50 MG tablet Take 1 tablet (50 mg total) by mouth daily with breakfast. 5 tablet 0  . sertraline (ZOLOFT) 100 MG tablet Take 100 mg by  mouth daily.    Marland Kitchen. thyroid (ARMOUR) 15 MG tablet Take 15 mg by mouth daily.    Marland Kitchen. thyroid (ARMOUR) 90 MG tablet Take 90 mg by mouth daily.    Marland Kitchen. triamcinolone ointment (KENALOG) 0.5 % Apply 1 application topically 2 (two) times daily as needed (for rash).    . Vitamin D, Ergocalciferol, (DRISDOL) 50000 units CAPS capsule Take 1 capsule by mouth once a week.    . zolpidem (AMBIEN) 10 MG tablet Take 10 mg by mouth at bedtime as needed for sleep.    . diazepam (VALIUM) 5 MG tablet Take 1 tablet (5 mg total) by mouth every 8 (eight) hours as needed for muscle spasms. (Patient not taking: Reported on 03/29/2016) 12 tablet 0  . oxyCODONE-acetaminophen (PERCOCET/ROXICET) 5-325 MG tablet Take 1 tablet by mouth every 6 (six) hours as needed for severe pain. (Patient not taking: Reported on 03/29/2016) 15 tablet 0   No current facility-administered medications for this visit.     ROS:    General:  No weight loss, Fever, chills  HEENT: No recent headaches, no nasal bleeding, no visual changes, no sore throat  Neurologic: No dizziness, blackouts, seizures. No recent symptoms of stroke or mini- stroke. No recent episodes of slurred speech, or temporary blindness.  Cardiac: No recent episodes of chest pain/pressure, no shortness of breath at rest.  No shortness of breath with exertion.  Denies history of atrial fibrillation or irregular heartbeat  Vascular: No history of rest pain in feet.  No history of claudication.  No history of non-healing ulcer, No history of DVT   Pulmonary: No home oxygen, no productive cough, no hemoptysis,  No asthma or wheezing  Musculoskeletal:  [ ]  Arthritis, [ ]  Low back pain,  [ ]  Joint pain  Hematologic:No history of hypercoagulable state.  No history of easy bleeding.  No history of anemia  Gastrointestinal: No hematochezia or melena,  No gastroesophageal reflux, no trouble swallowing  Urinary: [ ]  chronic Kidney disease, [ ]  on HD - [ ]  MWF or [ ]  TTHS, [ ]  Burning with urination, [ ]  Frequent urination, [ ]  Difficulty urinating;   Skin: No rashes  Psychological: No history of anxiety,  No history of depression   Physical Examination  Vitals:   03/29/16 1502  BP: 134/83  Pulse: 75  Resp: 18  Temp: 98.8 F (37.1 C)  TempSrc: Oral  SpO2: 96%  Weight: 165 lb 6.4 oz (75 kg)  Height: 4\' 11"  (1.499 m)    Body mass index is 33.41 kg/m.  General:  Alert and oriented, no acute distress HEENT: Normal Neck: No bruit or JVD Pulmonary: Clear to auscultation bilaterally Cardiac: Regular Rate and Rhythm without murmur Abdomen: Soft, non-tender, non-distended, no mass Skin: No rash, left calf and foot slightly cooler than right Extremity Pulses:  2+ radial, brachial, femoral, dorsalis pedis pulses bilaterally, Posterior tibial pulses not palpable Musculoskeletal: No edema  Neurologic: Upper and lower extremity motor 5/5 and  symmetric  DATA:  Patient had bilateral ABIs performed today which were triphasic normal and greater than 1 bilaterally  I reviewed the patient's venous duplex from last month which showed no evidence of DVT  ASSESSMENT:  Patient with coolness in her left lower leg and pain in the calf which is improved from 2 months ago but still present. She has a normal clinical exam with palpable pulses in her feet bilaterally. She has normal ABIs and normal venous duplex exam. I do not believe her symptoms  are from a vascular etiology. The coolness sensation could be neurologic in nature or some type of autonomic dysfunction. I'm not sure if her lupus could be contributing to this. She apparently did have some type of neurologic workup at [facility] recently but this was not completely related to her current symptoms. Records of this were unavailable for review today.   PLAN:  Patient currently has no evidence of peripheral arterial disease with normal clinical and noninvasive vascular exam. She will follow-up with me on an as-needed basis. I discussed with her the possibility of one back to see a neurologist if her symptoms persist or do not improve to look for other etiologies from a neurologic or autonomic dysfunction standpoint.   Fabienne Bruns, MD [facility] Office: [phone] Pager: [phone]

## 2016-04-10 ENCOUNTER — Encounter (HOSPITAL_COMMUNITY): Payer: BLUE CROSS/BLUE SHIELD

## 2016-04-10 ENCOUNTER — Encounter: Payer: BLUE CROSS/BLUE SHIELD | Admitting: Vascular Surgery

## 2016-05-23 IMAGING — CT CT CERVICAL SPINE W/O CM
4 series · 15 of 33 positions shown, 18 images · non-contrast
Comparison: None.

CLINICAL DATA: 49-year-old presenting with 1 week history of
left-sided neck pain and left neck muscle spasms that began earlier
today. No known injuries. Personal history of stroke approximately 2
years ago affecting the left side of the body.

EXAM:
CT CERVICAL SPINE WITHOUT CONTRAST
TECHNIQUE: Multidetector CT imaging of the cervical spine was performed without
intravenous contrast. Multiplanar CT image reconstructions were also
generated.

[Series 4: c_spine 2.0 st · axial · 0.25mm/px · z∈[-188,-94]mm · 5 of 71 slices shown, 7 images]
[im 12/71  soft-tissue]
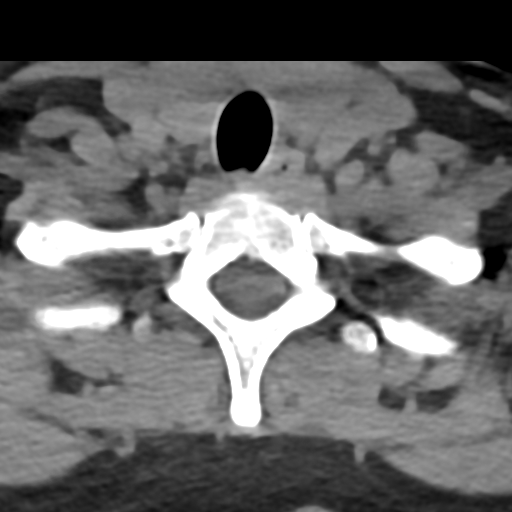
[im 12/71  bone]
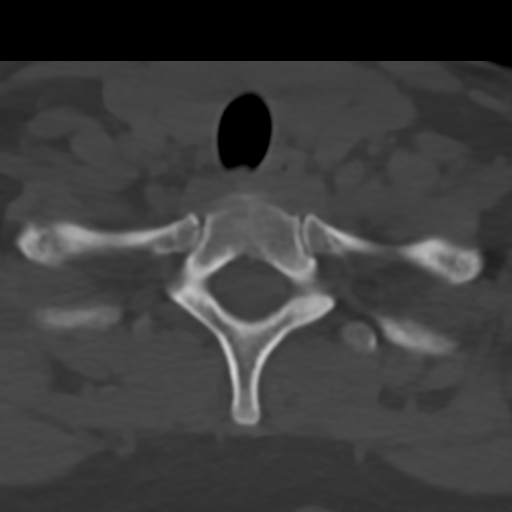
[im 24/71  bone]
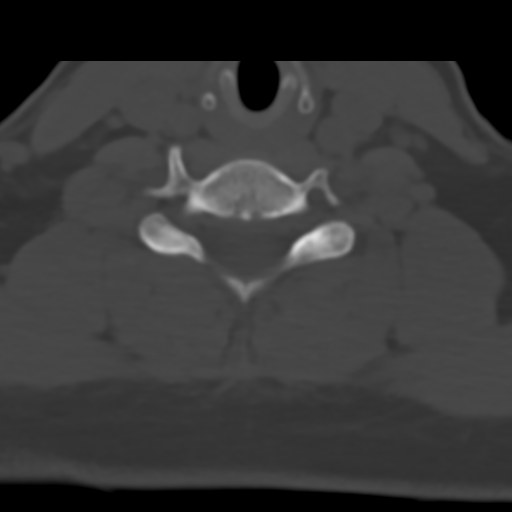
[im 36/71  bone]
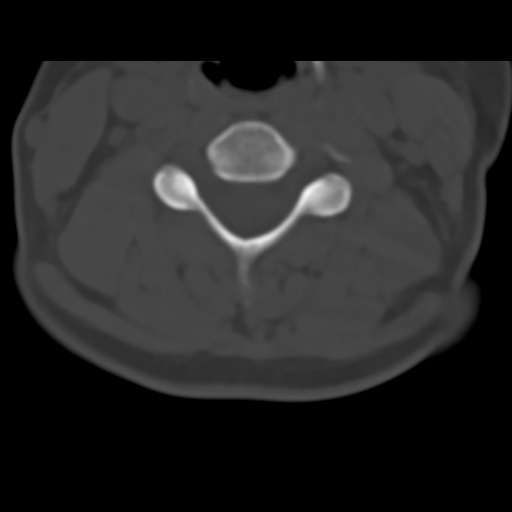
[im 47/71  bone]
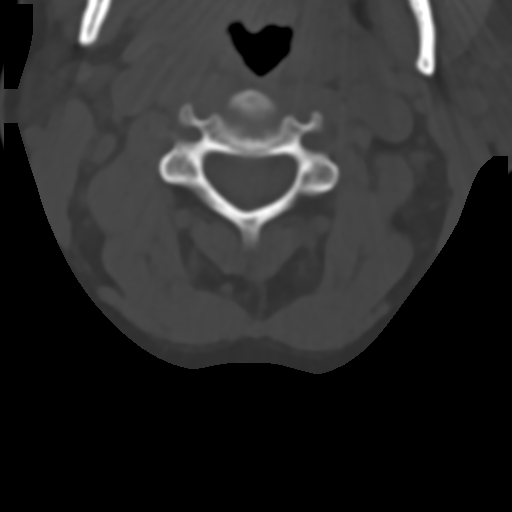
[im 59/71  soft-tissue]
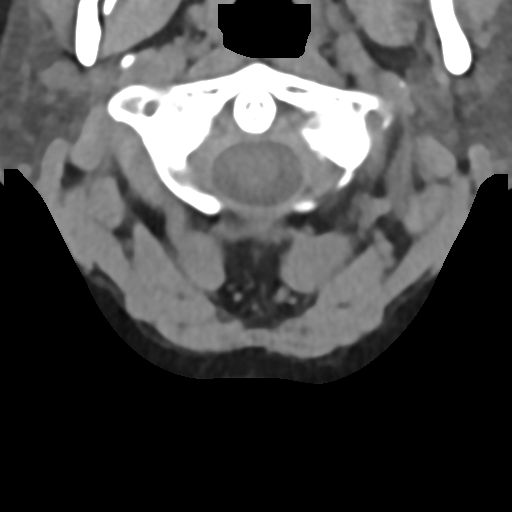
[im 59/71  bone]
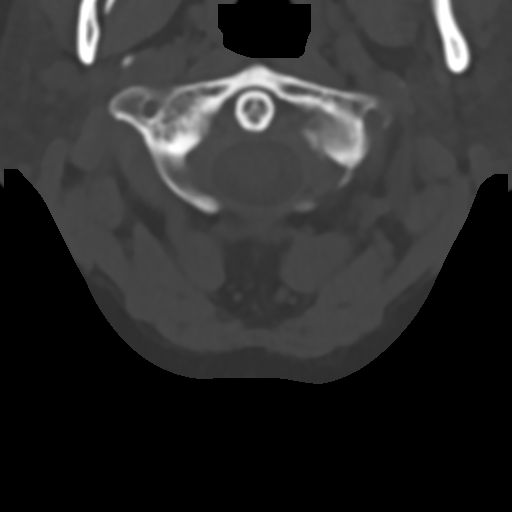

[Series 6: c_spine 2.0 sag bone · sagittal · 0.25mm/px · 5 of 61 slices shown, 6 images]
[im 21/61  bone]
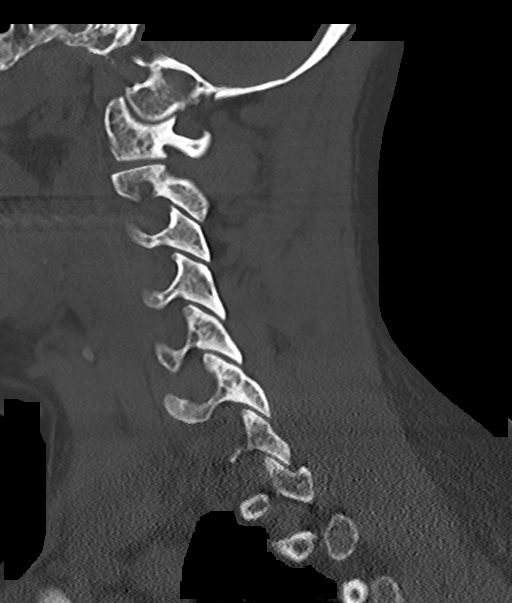
[im 26/61  bone]
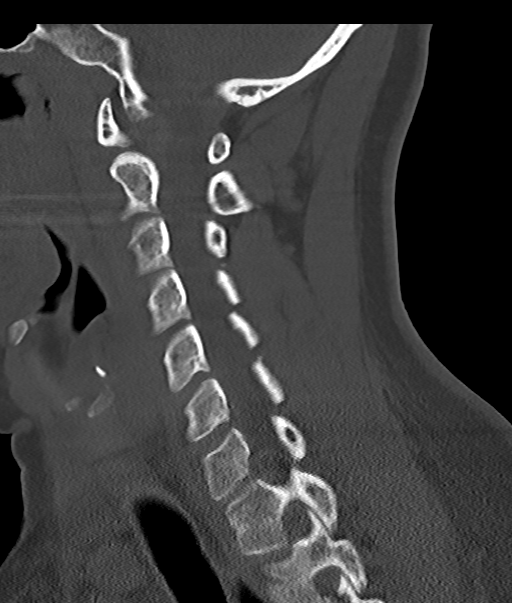
[im 31/61  soft-tissue]
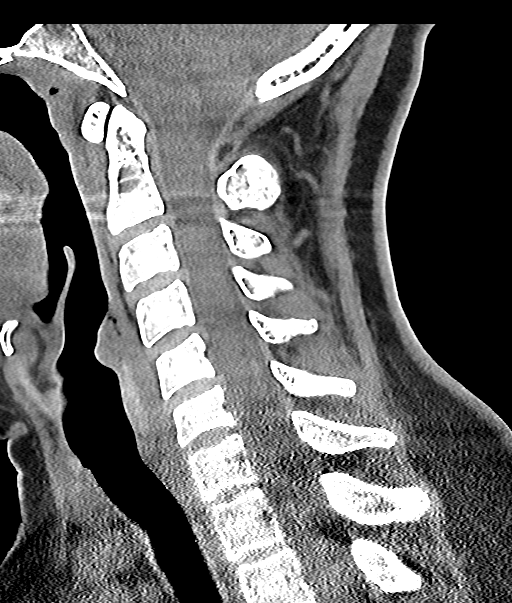
[im 31/61  bone]
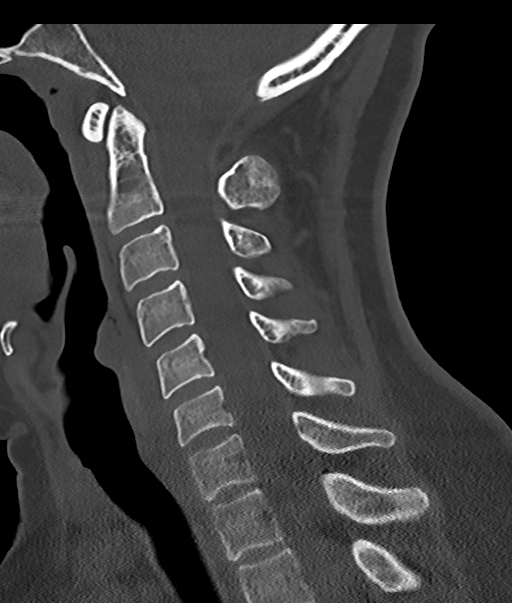
[im 36/61  bone]
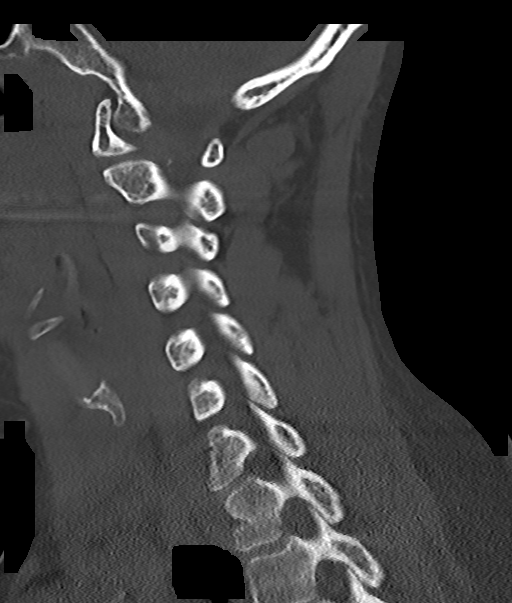
[im 41/61  bone]
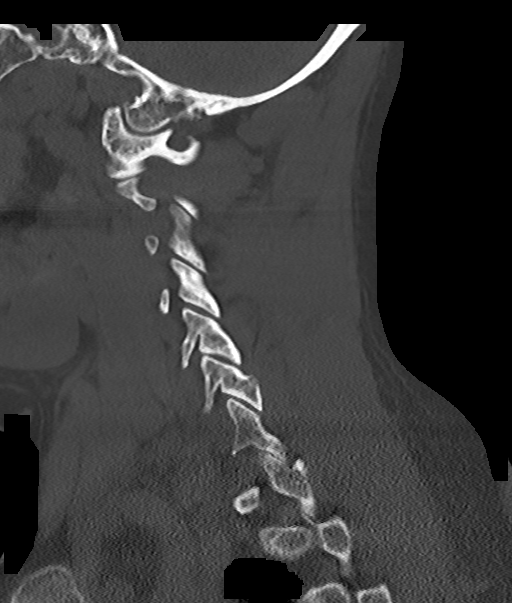

[Series 7: c_spine 2.0 cor bone · coronal · 0.23mm/px · 3 of 61 slices shown]
[im 13/61  bone]
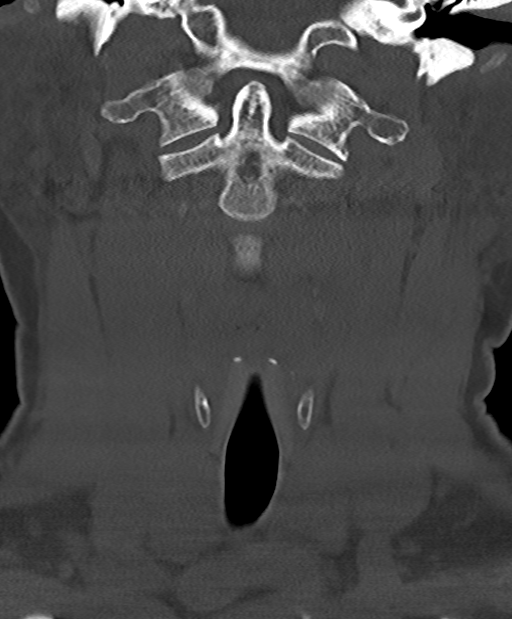
[im 25/61  bone]
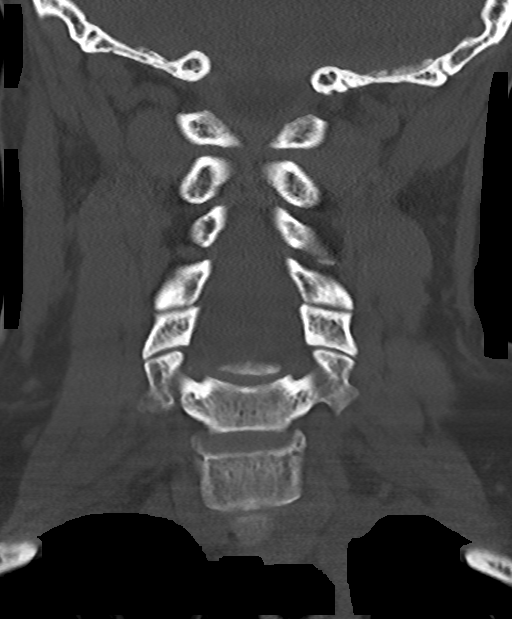
[im 37/61  bone]
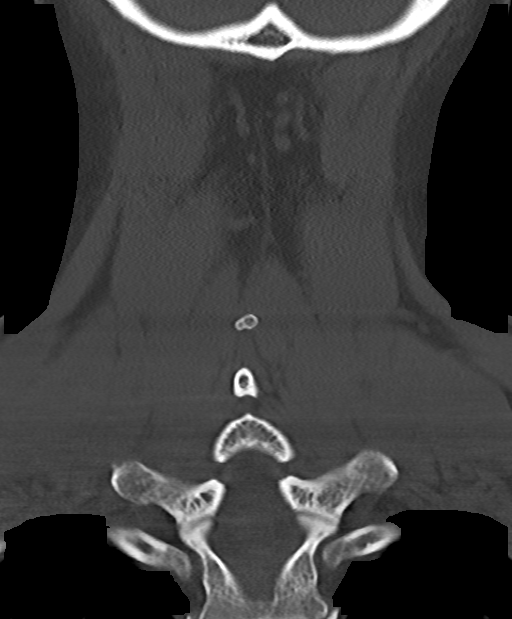

[Series 8: c_spine 2.0 orthogonals · axial · 0.21mm/px · z∈[-204,-180]mm · 2 of 81 slices shown]
[im 14/81  bone]
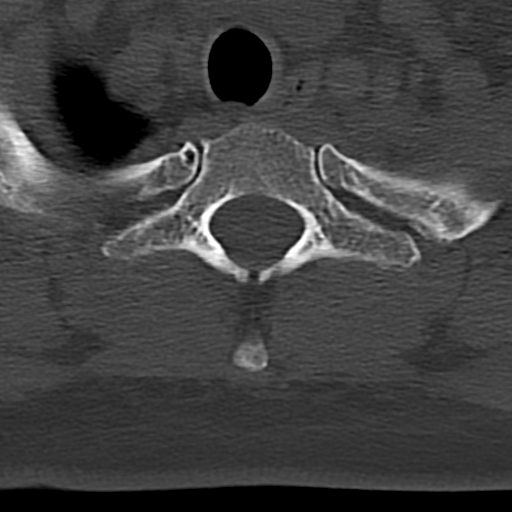
[im 27/81  bone]
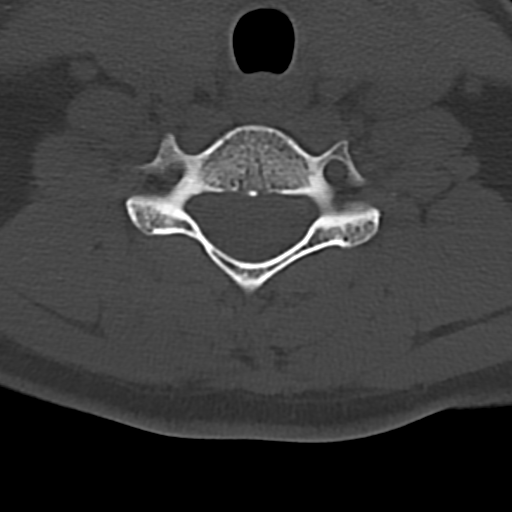

[15 of 33 positions shown; findings below may reference images not displayed]

FINDINGS: No fractures identified involving the cervical spine. Sagittal
reconstructed images demonstrate anatomic alignment. Facet joints
intact throughout without significant degenerative change. No spinal
stenosis. Mild central disc protrusion at C5-6. Coronal reformatted
images demonstrate an intact craniocervical junction, intact C1-C2
articulation, intact dens, and intact lateral masses throughout. No
evidence of neural foraminal stenosis at any level.
IMPRESSION: 1. Mild central disc protrusion at C5-6.
2. Otherwise normal examination.

## 2024-02-10 ENCOUNTER — Other Ambulatory Visit (HOSPITAL_BASED_OUTPATIENT_CLINIC_OR_DEPARTMENT_OTHER): Payer: Self-pay

## 2024-02-10 ENCOUNTER — Other Ambulatory Visit (HOSPITAL_BASED_OUTPATIENT_CLINIC_OR_DEPARTMENT_OTHER): Payer: Self-pay | Admitting: Family Medicine

## 2024-02-10 DIAGNOSIS — Z1231 Encounter for screening mammogram for malignant neoplasm of breast: Secondary | ICD-10-CM

## 2024-03-23 ENCOUNTER — Ambulatory Visit (HOSPITAL_BASED_OUTPATIENT_CLINIC_OR_DEPARTMENT_OTHER): Payer: Self-pay
